# Patient Record
Sex: Female | Born: 1970 | Race: White | Hispanic: No | Marital: Married | Smoking: Never smoker
Health system: Southern US, Community
[De-identification: ages and names within clinical notes are randomized; demographics above are authoritative.]

## PROBLEM LIST (undated history)

## (undated) DIAGNOSIS — F419 Anxiety disorder, unspecified: Secondary | ICD-10-CM

## (undated) HISTORY — PX: BUNIONECTOMY: SHX129

## (undated) HISTORY — PX: COLONOSCOPY: SHX174

## (undated) HISTORY — PX: NO PAST SURGERIES: SHX2092

---

## 2009-08-25 ENCOUNTER — Ambulatory Visit: Payer: Self-pay | Admitting: Family Medicine

## 2010-06-26 ENCOUNTER — Ambulatory Visit: Payer: Self-pay | Admitting: Family Medicine

## 2010-11-08 ENCOUNTER — Ambulatory Visit: Payer: Self-pay | Admitting: Family Medicine

## 2011-11-28 ENCOUNTER — Ambulatory Visit: Payer: Self-pay | Admitting: Family Medicine

## 2012-02-10 DIAGNOSIS — D126 Benign neoplasm of colon, unspecified: Secondary | ICD-10-CM | POA: Insufficient documentation

## 2012-12-10 ENCOUNTER — Ambulatory Visit: Payer: Self-pay | Admitting: Internal Medicine

## 2013-04-16 ENCOUNTER — Ambulatory Visit: Payer: Self-pay

## 2014-04-21 ENCOUNTER — Ambulatory Visit: Payer: Self-pay | Admitting: Family Medicine

## 2014-07-19 DIAGNOSIS — M542 Cervicalgia: Secondary | ICD-10-CM | POA: Insufficient documentation

## 2015-02-16 ENCOUNTER — Encounter: Payer: Self-pay | Admitting: Emergency Medicine

## 2015-02-16 ENCOUNTER — Ambulatory Visit
Admission: EM | Admit: 2015-02-16 | Discharge: 2015-02-16 | Disposition: A | Discharge status: Home / Self Care | Payer: BC Managed Care – PPO | Attending: Emergency Medicine | Admitting: Emergency Medicine

## 2015-02-16 DIAGNOSIS — J019 Acute sinusitis, unspecified: Secondary | ICD-10-CM

## 2015-02-16 MED ORDER — FLUTICASONE PROPIONATE 50 MCG/ACT NA SUSP
2.0000 | Freq: Every day | NASAL | Status: DC
Start: 2015-02-16 — End: 2017-11-15

## 2015-02-16 MED ORDER — IBUPROFEN 800 MG PO TABS: 800.0000 mg | ORAL_TABLET | Freq: Three times a day (TID) | ORAL | Status: DC | PRN

## 2015-02-16 NOTE — ED Provider Notes (Signed)
  SUBJECTIVE:  Victoria Baldwin is a 44 y.o. female who presents with nasal bridge pain, tenderness, frontal/maxillary sinus pressure for the past 4 days. She reports frontal headaches at the end of the day. Symptoms are worse with palpating her nasal bridge better with not touching it, she tried Advil 400 mg and steam inhalation without much improvement. No nausea, vomiting, fevers, postnasal drip, coughing, wheezing. No facial swelling, nasal congestion, dental pain, pain worse with bending forward or lying down, purulent nasal drainage. No ear pain, sick contacts, allergy symptoms. Past medical history negative for diabetes, hypertension, sinusitis, allergies. Denies any trauma to the bridge of her nose.  History reviewed. No pertinent past medical history.  Past Surgical History:   NO PAST SURGERIES                                            No family history on file.     Smoking Status: Never Smoker                     Smokeless Status: Never Used                       Alcohol Use: Yes            No current facility-administered medications for this encounter. Current outpatient prescriptions: .  busPIRone (BUSPAR) 5 MG tablet, Take 5 mg by mouth once., Disp: , Rfl:   No Known Allergies   ROS  As noted in HPI.   Physical Exam  BP 110/73 mmHg  Pulse 75  Temp(Src) 97.4 F (36.3 C) (Tympanic)  Resp 16  Ht 5\' 7"  (1.702 m)  Wt 145 lb (65.772 kg)  BMI 22.71 kg/m2  SpO2 100%  LMP 12/01/2014  Constitutional: Well developed, well nourished, no acute distress Eyes:  EOMI, conjunctiva normal bilaterally HENT: Normocephalic, atraumatic,mucus membranes moist. TMs normal bilaterally. +  clear scant nasal congestion. normal turbinates. + nasal bridge tenderness, no crepitus, bruising. - maxillary, frontal sinus tenderness. Oropharynx normal. - postnasal drip.  Respiratory: Normal inspiratory effort Cardiovascular: Normal rate GI: nondistended skin: No rash, skin  intact Musculoskeletal: no deformities No cervical lymphadenopathy. Neurologic: Alert & oriented x 3, no focal neuro deficits Psychiatric: Speech and behavior appropriate   ED Course   Medications - No data to display  No orders of the defined types were placed in this encounter.    No results found for this or any previous visit (from the past 24 hour(s)). No results found.  ED Clinical Impression  Acute sinusitis, recurrence not specified, unspecified location   ED Assessment/Plan  Could be ethmoidal sinusitis, no evidence of trauma. No fevers >102, has had sx for < 10 days, no h/o double sickening. No historical or objective evidence of bacterial infection. No indication for abx. Will start cool compresses on the nose, nasal steriods, mucinex, increase fluids, nasal saline irrigation,  tylenol/motrin prn pain. Discussed MDM and plan with pt. Discussed sn/sx that should prompt return to the UC or ED. Pt agrees with plan and will f/u with PMD prn.   *This clinic note was created using Dragon dictation software. Therefore, there may be occasional mistakes despite careful proofreading.    Melynda Ripple, MD 02/16/15 [phone]

## 2015-02-16 NOTE — Discharge Instructions (Signed)
Start regular mucinex in addition to the ibuprofen and nasal steroids. Return if you get worse, have a fever >100.4, or for any concerns. You may take 800 mg of motrin with 1 gram of tylenol up to 3 times a day as needed for pain. This is an effective combination for pain.  Most sinus infections are viral and do not need antibiotics unless you have a high fever, have had this for 10 day, or you get better and then get sick again. Use a neti pot or the NeilMed sinus rinse as often as you want to to reduce nasal congestion. Follow the directions on the box.   Go to www.goodrx.com to look up your medications. This will give you a list of where you can find your prescriptions at the most affordable prices.

## 2015-02-16 NOTE — ED Notes (Signed)
Sinus pressure for 4 days

## 2016-04-30 ENCOUNTER — Other Ambulatory Visit: Payer: Self-pay | Admitting: Family Medicine

## 2016-04-30 DIAGNOSIS — Z1231 Encounter for screening mammogram for malignant neoplasm of breast: Secondary | ICD-10-CM

## 2016-05-28 ENCOUNTER — Encounter: Payer: Self-pay | Admitting: Radiology

## 2016-05-28 ENCOUNTER — Ambulatory Visit
Admission: RE | Admit: 2016-05-28 | Discharge: 2016-05-28 | Disposition: A | Discharge status: Home / Self Care | Payer: BC Managed Care – PPO | Source: Ambulatory Visit | Attending: Family Medicine | Admitting: Family Medicine

## 2016-05-28 DIAGNOSIS — Z1231 Encounter for screening mammogram for malignant neoplasm of breast: Secondary | ICD-10-CM | POA: Insufficient documentation

## 2016-11-02 ENCOUNTER — Other Ambulatory Visit: Payer: Self-pay | Admitting: Family Medicine

## 2016-11-02 ENCOUNTER — Ambulatory Visit
Admission: RE | Admit: 2016-11-02 | Discharge: 2016-11-02 | Disposition: A | Discharge status: Home / Self Care | Payer: BC Managed Care – PPO | Source: Ambulatory Visit | Attending: Family Medicine | Admitting: Family Medicine

## 2016-11-02 DIAGNOSIS — N83201 Unspecified ovarian cyst, right side: Secondary | ICD-10-CM | POA: Diagnosis not present

## 2016-11-02 DIAGNOSIS — R102 Pelvic and perineal pain: Secondary | ICD-10-CM

## 2017-01-18 DIAGNOSIS — F419 Anxiety disorder, unspecified: Secondary | ICD-10-CM | POA: Insufficient documentation

## 2017-01-18 LAB — HM COLONOSCOPY

## 2017-05-14 ENCOUNTER — Other Ambulatory Visit: Payer: Self-pay | Admitting: Family Medicine

## 2017-06-27 ENCOUNTER — Other Ambulatory Visit: Payer: Self-pay | Admitting: Family Medicine

## 2017-06-27 DIAGNOSIS — Z1231 Encounter for screening mammogram for malignant neoplasm of breast: Secondary | ICD-10-CM

## 2017-07-15 ENCOUNTER — Encounter (INDEPENDENT_AMBULATORY_CARE_PROVIDER_SITE_OTHER): Payer: Self-pay

## 2017-07-15 ENCOUNTER — Ambulatory Visit
Admission: RE | Admit: 2017-07-15 | Discharge: 2017-07-15 | Disposition: A | Discharge status: Home / Self Care | Payer: BC Managed Care – PPO | Source: Ambulatory Visit | Attending: Family Medicine | Admitting: Family Medicine

## 2017-07-15 DIAGNOSIS — Z1231 Encounter for screening mammogram for malignant neoplasm of breast: Secondary | ICD-10-CM | POA: Insufficient documentation

## 2017-11-15 ENCOUNTER — Encounter: Payer: Self-pay | Admitting: Sports Medicine

## 2017-11-15 ENCOUNTER — Ambulatory Visit: Payer: BC Managed Care – PPO | Admitting: Sports Medicine

## 2017-11-15 DIAGNOSIS — L603 Nail dystrophy: Secondary | ICD-10-CM

## 2017-11-15 DIAGNOSIS — L608 Other nail disorders: Secondary | ICD-10-CM | POA: Diagnosis not present

## 2017-11-15 DIAGNOSIS — M79675 Pain in left toe(s): Secondary | ICD-10-CM | POA: Diagnosis not present

## 2017-11-15 MED ORDER — NEOMYCIN-POLYMYXIN-HC 3.5-10000-1 OT SOLN: OTIC | 0 refills | Status: DC

## 2017-11-15 NOTE — Progress Notes (Signed)
Subjective: Victoria Baldwin is a 47 y.o. female patient seen today in office with complaint of mildly painful thickened left hallux toenail with dried blood states that she bruised it 7 months ago while wearing tennis shoes at a fall festival where she walked for 9 hours straight states that the inner corner has become tender and sore and has used Epson salt.  Reports that pain on average about a 5 out of 10 especially with certain shoes at present with the toe.  Patient states that she wants to discuss treatment options however is very nervous and does not want anything aggressively done.  Patient denies history of Diabetes, Neuropathy, or Vascular disease. Patient has no other pedal complaints at this time.   Review of Systems  Musculoskeletal:       Left toe pain  All other systems reviewed and are negative.   There are no active problems to display for this patient.   No current outpatient medications on file prior to visit.   No current facility-administered medications on file prior to visit.     No Known Allergies  Objective: Physical Exam  General: Well developed, nourished, no acute distress, awake, alert and oriented x 3  Vascular: Dorsalis pedis artery 2/4 bilateral, Posterior tibial artery 2/4 bilateral, skin temperature warm to warm proximal to distal bilateral lower extremities, no varicosities, pedal hair present bilateral.  Neurological: Gross sensation present via light touch bilateral.   Dermatological: Skin is warm, dry, and supple bilateral, left hallux toenail has trauma lines with a distal dry blood and mild incurvation at the lateral margin without swelling without drainage however there is mild tenderness to the margin, all other nails are within normal limits, no webspace macerations present bilateral, no open lesions present bilateral, no callus/corns/hyperkeratotic tissue present bilateral. No signs of infection bilateral.  Musculoskeletal: Mild  asymptomatic bunion deformity noted bilateral. Muscular strength within normal limits without painon range of motion. No pain with calf compression bilateral.  Assessment and Plan:  Problem List Items Addressed This Visit    None    Visit Diagnoses    Nail dystrophy    -  Primary   Toe pain, left       Nail hemorrhage         -Examined patient.  -Discussed treatment options for painful dystrophic left great toenail with dried blood distally and trauma lines; patient declined a nail procedure and opted for me to trim her nail at today's visit -Mechanically debrided and reduced dystrophic nail with sterile nail nipper and dremel nail file without incident. -Recommend patient to soak with Epson salt and apply Neosporin with lidocaine if needed if the corner is sore after I have trimmed today -Patient to return as needed for follow-up evaluation or sooner if symptoms worsen.  Landis Martins, DPM

## 2017-11-25 ENCOUNTER — Encounter: Payer: Self-pay | Admitting: Sports Medicine

## 2017-11-26 ENCOUNTER — Ambulatory Visit (INDEPENDENT_AMBULATORY_CARE_PROVIDER_SITE_OTHER): Payer: BC Managed Care – PPO | Admitting: Podiatry

## 2017-11-26 DIAGNOSIS — L600 Ingrowing nail: Secondary | ICD-10-CM

## 2017-12-04 ENCOUNTER — Ambulatory Visit: Payer: BC Managed Care – PPO | Admitting: Sports Medicine

## 2017-12-15 NOTE — Progress Notes (Signed)
  Subjective:  Patient ID: Victoria Baldwin, female    DOB: July 25, 1970,  MRN: [id number]  Chief Complaint  Patient presents with  . Nail check    L hallux ingrown removed 11-15-17 Pt. stated," it felt good for a while but it feels really sore again (lateral border) ." Tx: drops (rx by Dr. Cannon Kettle), and epsom salt soaking   48 y.o. female returns for the above complaint.  States that the nail fungus for a while but got really sore again on the outside border  Objective:  There were no vitals filed for this visit. General AA&O x3. Normal mood and affect.  Vascular Pedal pulses palpable.  Neurologic Epicritic sensation grossly intact.  Dermatologic  left hallux nail ingrowing lateral borders  Orthopedic:  An outpatient with a left hallux nail   Assessment & Plan:  Patient was evaluated and treated and all questions answered.  Left hallux onychomycosis with pain -Nail debrided in slant back fashion  No follow-ups on file.

## 2018-06-02 ENCOUNTER — Ambulatory Visit (INDEPENDENT_AMBULATORY_CARE_PROVIDER_SITE_OTHER): Payer: BC Managed Care – PPO

## 2018-06-02 ENCOUNTER — Ambulatory Visit: Payer: BC Managed Care – PPO | Admitting: Podiatry

## 2018-06-02 DIAGNOSIS — M21612 Bunion of left foot: Secondary | ICD-10-CM

## 2018-06-02 DIAGNOSIS — F419 Anxiety disorder, unspecified: Secondary | ICD-10-CM

## 2018-06-02 DIAGNOSIS — M7752 Other enthesopathy of left foot: Secondary | ICD-10-CM

## 2018-06-02 DIAGNOSIS — M21962 Unspecified acquired deformity of left lower leg: Secondary | ICD-10-CM

## 2018-06-02 DIAGNOSIS — M2012 Hallux valgus (acquired), left foot: Secondary | ICD-10-CM | POA: Diagnosis not present

## 2018-06-02 DIAGNOSIS — M357 Hypermobility syndrome: Secondary | ICD-10-CM

## 2018-06-02 DIAGNOSIS — F411 Generalized anxiety disorder: Secondary | ICD-10-CM

## 2018-06-02 NOTE — Progress Notes (Signed)
  Subjective:  Patient ID: Victoria Baldwin, female    DOB: 05/09/1971,  MRN: [id number]  Chief Complaint  Patient presents with  . Foot Pain    L hallux joint pain x years; 5/10 sharp pain (worst with walking a lot) -no injury Tx: none   . Foot Pain    L sub met 2 x couple months; 5/10 nerve pain -no injury Tx; none    47 y.o. female presents with the above complaint.  History as above states that she has had a bunion for several years and is starting to hurt more in the past couple years causing pain with her shoe gear also having pain on the bottom of the foot at the ball of the foot.  Review of Systems: Negative except as noted in the HPI. Denies N/V/F/Ch.  No past medical history on file. No current outpatient medications on file.  Social History   Tobacco Use  Smoking Status Never Smoker  Smokeless Tobacco Never Used    Allergies  Allergen Reactions  . Steri-Strip Compound Benzoin [Benzoin Compound]   . Adhesive [Tape] Rash   Objective:  There were no vitals filed for this visit. There is no height or weight on file to calculate BMI. Constitutional Well developed. Well nourished.  Vascular Dorsalis pedis pulses palpable bilaterally. Posterior tibial pulses palpable bilaterally. Capillary refill normal to all digits.  No cyanosis or clubbing noted. Pedal hair growth normal.  Neurologic Normal speech. Oriented to person, place, and time. Epicritic sensation to light touch grossly present bilaterally.  Dermatologic Nails well groomed and normal in appearance. No open wounds. No skin lesions.  Orthopedic: Normal joint ROM without pain or crepitus bilaterally. Hallux abductovalgus deformity present bilaterally Left 1st MPJ full range of motion. Left 1st TMT with gross hypermobility. Right 1st MPJ full range of motion  Right 1st TMT with gross hypermobility. Lesser digital contractures absent bilaterally. Pain to palpation about the plantar second third  metatarsal heads   Radiographs: Taken and reviewed. Hallux abductovalgus deformity present. Metatarsal parabola abnormal - Elongated second third metatarsals, short first metatarsal. 1st/2nd IMA: Moderately increased.  Slight underlying metatarsus adductus  Assessment:   1. Hallux abductovalgus with bunions, left   2. Foot joint hypermobility   3. Metatarsal deformity, left   4. Capsulitis of metatarsophalangeal (MTP) joint of left foot   5. Anxiety due to invasive procedure    Plan:  Patient was evaluated and treated and all questions answered.  Hallux abductovalgus deformity,  -XR as above. -Discussed etiology of HAV deformity.  She has excessive joint mobility of the lower extremity joints leading to HAV formation discussed that anything short of Lapidus correction would likely to lead to recurrence.  Discussed continued conservative versus surgical care. -Patient has failed all conservative therapy and wishes to proceed with surgical intervention. All risks, benefits, and alternatives discussed with patient. No guarantees given. Consent reviewed and signed by patient. Post-op course explained at length. -Planned procedures: L Lapidus bunionectomy, 2nd and possibly 3rd metatarsal shortening osteotomy.  30 minutes of face to face time were spent with the patient. >50% of this was spent on counseling and coordination of care. Specifically discussed with patient the above diagnoses and overall treatment plan.  Return for post-op.

## 2018-06-02 NOTE — Patient Instructions (Signed)
Pre-Operative Instructions  Congratulations, you have decided to take an important step towards improving your quality of life.  You can be assured that the doctors and staff at [facility] will be with you every step of the way.  Here are some important things you should know:  1. Plan to be at the surgery center/hospital at least 1 (one) hour prior to your scheduled time, unless otherwise directed by the surgical center/hospital staff.  You must have a responsible adult accompany you, remain during the surgery and drive you home.  Make sure you have directions to the surgical center/hospital to ensure you arrive on time. 2. If you are having surgery at [facility] or [facility] hospitals, you will need a copy of your medical history and physical form from your family physician within one month prior to the date of surgery. We will give you a form for your primary physician to complete.  3. We make every effort to accommodate the date you request for surgery.  However, there are times where surgery dates or times have to be moved.  We will contact you as soon as possible if a change in schedule is required.   4. No aspirin/ibuprofen for one week before surgery.  If you are on aspirin, any non-steroidal anti-inflammatory medications (Mobic, Aleve, Ibuprofen) should not be taken seven (7) days prior to your surgery.  You make take Tylenol for pain prior to surgery.  5. Medications - If you are taking daily heart and blood pressure medications, seizure, reflux, allergy, asthma, anxiety, pain or diabetes medications, make sure you notify the surgery center/hospital before the day of surgery so they can tell you which medications you should take or avoid the day of surgery. 6. No food or drink after midnight the night before surgery unless directed otherwise by surgical center/hospital staff. 7. No alcoholic beverages 24-hours prior to surgery.  No smoking 24-hours prior or 24-hours after  surgery. 8. Wear loose pants or shorts. They should be loose enough to fit over bandages, boots, and casts. 9. Don't wear slip-on shoes. Sneakers are preferred. 10. Bring your boot with you to the surgery center/hospital.  Also bring crutches or a walker if your physician has prescribed it for you.  If you do not have this equipment, it will be provided for you after surgery. 11. If you have not been contacted by the surgery center/hospital by the day before your surgery, call to confirm the date and time of your surgery. 12. Leave-time from work may vary depending on the type of surgery you have.  Appropriate arrangements should be made prior to surgery with your employer. 13. Prescriptions will be provided immediately following surgery by your doctor.  Fill these as soon as possible after surgery and take the medication as directed. Pain medications will not be refilled on weekends and must be approved by the doctor. 14. Remove nail polish on the operative foot and avoid getting pedicures prior to surgery. 15. Wash the night before surgery.  The night before surgery wash the foot and leg well with water and the antibacterial soap provided. Be sure to pay special attention to beneath the toenails and in between the toes.  Wash for at least three (3) minutes. Rinse thoroughly with water and dry well with a towel.  Perform this wash unless told not to do so by your physician.  Enclosed: 1 Ice pack (please put in freezer the night before surgery)   1 Hibiclens skin cleaner     Pre-op instructions  If you have any questions regarding the instructions, please do not hesitate to call our office.  [place]: [address], , [place] [address] -- [phone]  [place]: [address] [place], [place] [address] -- [phone]  [place]: [address]  [place], [place] [address] -- [phone]  [place]: [address], [address], [place], [place] [address] -- [phone]  Website:  [email or web address] 

## 2018-06-03 ENCOUNTER — Telehealth: Payer: Self-pay | Admitting: *Deleted

## 2018-06-03 ENCOUNTER — Encounter: Payer: Self-pay | Admitting: Podiatry

## 2018-06-03 NOTE — Telephone Encounter (Signed)
I am calling to let you know I have you scheduled for your surgery on tomorrow.  "On tomorrow, I didn't realize he was talking about tomorrow.  I know he mentioned doing it on Friday but I told him I can't do it on Friday.  I just saw him once which was yesterday.  I want to wait until January."  I am sorry for the misunderstanding.  Dr. March Rummage does surgery on Wednesdays.  Do you have a date in mine that you would like to have it done?  "Does he have any time available on January 22?"  Yes, he can do it on January 22.  "I guess I will hear from someone closer to the date?"  Yes, that is correct.  Someone from the surgical center will give you a call a day or two prior to your surgery date.  I called and left a message for Caren Griffins at the surgical center.  I asked her to reschedule Ms. Chana Bode from December 18 to January 22.

## 2018-06-03 NOTE — Telephone Encounter (Signed)
Postop appointments have been rescheduled to reflect new surgery date.

## 2018-06-09 ENCOUNTER — Other Ambulatory Visit: Payer: Self-pay | Admitting: Podiatry

## 2018-06-09 DIAGNOSIS — M21962 Unspecified acquired deformity of left lower leg: Secondary | ICD-10-CM

## 2018-06-09 DIAGNOSIS — M2012 Hallux valgus (acquired), left foot: Secondary | ICD-10-CM

## 2018-06-09 DIAGNOSIS — M21612 Bunion of left foot: Principal | ICD-10-CM

## 2018-06-13 ENCOUNTER — Encounter: Payer: BC Managed Care – PPO | Admitting: Podiatry

## 2018-06-23 ENCOUNTER — Encounter: Payer: BC Managed Care – PPO | Admitting: Podiatry

## 2018-07-09 ENCOUNTER — Other Ambulatory Visit: Payer: Self-pay | Admitting: Podiatry

## 2018-07-09 ENCOUNTER — Other Ambulatory Visit: Payer: Self-pay | Admitting: *Deleted

## 2018-07-09 DIAGNOSIS — M2022 Hallux rigidus, left foot: Secondary | ICD-10-CM

## 2018-07-09 DIAGNOSIS — M2012 Hallux valgus (acquired), left foot: Secondary | ICD-10-CM

## 2018-07-09 MED ORDER — OXYCODONE-ACETAMINOPHEN 10-325 MG PO TABS: 1.0000 | ORAL_TABLET | ORAL | 0 refills | Status: DC | PRN

## 2018-07-09 MED ORDER — ONDANSETRON HCL 4 MG PO TABS: 4.0000 mg | ORAL_TABLET | Freq: Three times a day (TID) | ORAL | 0 refills | Status: DC | PRN

## 2018-07-09 MED ORDER — CEPHALEXIN 500 MG PO CAPS: 500.0000 mg | ORAL_CAPSULE | Freq: Two times a day (BID) | ORAL | 0 refills | Status: DC

## 2018-07-09 NOTE — Progress Notes (Signed)
Rx sent to pharmacy for outpatient surgery. °

## 2018-07-09 NOTE — Progress Notes (Signed)
Per Dr. March Rummage, I put in an order for a knee scooter.  I sent a message to Patsy Baltimore at [facility] to see if they can assist in getting the patient the knee scooter.

## 2018-07-10 ENCOUNTER — Telehealth: Payer: Self-pay | Admitting: Podiatry

## 2018-07-10 NOTE — Telephone Encounter (Signed)
Pt had surgery yesterday and would like to know when she can expect to get her knee scooter. Please give pt a call.

## 2018-07-10 NOTE — Telephone Encounter (Signed)
I informed pt Victoria Baldwin states they are processing her knee scooter and will contact her. I told pt she should get a call tomorrow morning if not I would give her their phone number and she should call them. Pt states she can't get up and I told her I would leave the number on her voice mail. I called pt and left [phone].

## 2018-07-15 ENCOUNTER — Ambulatory Visit (INDEPENDENT_AMBULATORY_CARE_PROVIDER_SITE_OTHER): Payer: BC Managed Care – PPO | Admitting: Podiatry

## 2018-07-15 ENCOUNTER — Encounter: Payer: Self-pay | Admitting: Podiatry

## 2018-07-15 ENCOUNTER — Ambulatory Visit (INDEPENDENT_AMBULATORY_CARE_PROVIDER_SITE_OTHER): Payer: BC Managed Care – PPO

## 2018-07-15 ENCOUNTER — Other Ambulatory Visit: Payer: Self-pay

## 2018-07-15 VITALS — BP 99/57 | HR 77 | Temp 97.3°F | Resp 16

## 2018-07-15 DIAGNOSIS — M21962 Unspecified acquired deformity of left lower leg: Secondary | ICD-10-CM | POA: Diagnosis not present

## 2018-07-15 DIAGNOSIS — Z9889 Other specified postprocedural states: Secondary | ICD-10-CM

## 2018-07-15 DIAGNOSIS — M2012 Hallux valgus (acquired), left foot: Secondary | ICD-10-CM

## 2018-07-15 DIAGNOSIS — M21612 Bunion of left foot: Secondary | ICD-10-CM

## 2018-07-15 DIAGNOSIS — M357 Hypermobility syndrome: Secondary | ICD-10-CM

## 2018-07-15 NOTE — Progress Notes (Signed)
  Subjective:  Patient ID: Victoria Baldwin, female    DOB: 1971-03-30,  MRN: [id number]  Chief Complaint  Patient presents with  . Routine Post Op    POV #1 Pt.s tates," seems to be doing fine, but with pain; 6/10 pain." Tx: cam boot, scooter, tylenol and icing -pt deneis V/F/Ch -d/c oxycodone due to Nausea    DOS: 07/09/2018 Procedure: Left Lapidus Bunionectomy  48 y.o. female returns for post-op check.   Review of Systems: Negative except as noted in the HPI. Denies N/V/F/Ch.  No past medical history on file.  Current Outpatient Medications:  .  cephALEXin (KEFLEX) 500 MG capsule, Take 1 capsule (500 mg total) by mouth 2 (two) times daily., Disp: 14 capsule, Rfl: 0 .  ondansetron (ZOFRAN) 4 MG tablet, Take 1 tablet (4 mg total) by mouth every 8 (eight) hours as needed for nausea or vomiting., Disp: 20 tablet, Rfl: 0 .  oxyCODONE-acetaminophen (PERCOCET) 10-325 MG tablet, Take 1 tablet by mouth every 4 (four) hours as needed for pain., Disp: 20 tablet, Rfl: 0 .  progesterone (PROMETRIUM) 200 MG capsule, , Disp: , Rfl:   Social History   Tobacco Use  Smoking Status Never Smoker  Smokeless Tobacco Never Used    Allergies  Allergen Reactions  . Steri-Strip Compound Benzoin [Benzoin Compound]   . Adhesive [Tape] Rash   Objective:   Vitals:   07/15/18 1111  BP: (!) 99/57  Pulse: 77  Resp: 16  Temp: (!) 97.3 F (36.3 C)   There is no height or weight on file to calculate BMI. Constitutional Well developed. Well nourished.  Vascular Foot warm and well perfused. Capillary refill normal to all digits.   Neurologic Normal speech. Oriented to person, place, and time. Epicritic sensation to light touch grossly present bilaterally.  Dermatologic Skin healing well without signs of infection. Skin edges well coapted without signs of infection.  Orthopedic: Tenderness to palpation noted about the surgical site.   Radiographs: Taken and reviewed c/w post-op  state Assessment:   1. Post-operative state   2. Hallux abducto valgus, left   3. Hallux abductovalgus with bunions, left   4. Foot joint hypermobility   5. Metatarsal deformity, left    Plan:  Patient was evaluated and treated and all questions answered.  S/p foot surgery left -Progressing as expected post-operatively. -XR: As abov3 -WB Status: NWB in CAM boot with knee scooter -Sutures: intact. -Medications: none refilled. -Foot redressed.  No follow-ups on file.

## 2018-07-22 ENCOUNTER — Encounter: Payer: Self-pay | Admitting: Podiatry

## 2018-07-22 ENCOUNTER — Ambulatory Visit (INDEPENDENT_AMBULATORY_CARE_PROVIDER_SITE_OTHER): Payer: BC Managed Care – PPO | Admitting: Podiatry

## 2018-07-22 VITALS — BP 111/70 | HR 84 | Temp 97.7°F | Resp 16

## 2018-07-22 DIAGNOSIS — Z9889 Other specified postprocedural states: Secondary | ICD-10-CM

## 2018-07-22 DIAGNOSIS — M2012 Hallux valgus (acquired), left foot: Secondary | ICD-10-CM

## 2018-07-23 NOTE — Progress Notes (Signed)
  Subjective:  Patient ID: Victoria Baldwin, female    DOB: 1970-10-03,  MRN: [id number]  Chief Complaint  Patient presents with  . Routine Post Op    POV #2 Pt.s tates," Sometimes on the toe I geel like a hot knife cutting throught it and it gets numbn, but pain is not greather than 5." Tx: cam boot and elevation -pt denies N/V?F?Ch     DOS: 07/09/2018 Procedure: Left Lapidus Bunionectomy  48 y.o. female returns for post-op check.   Review of Systems: Negative except as noted in the HPI. Denies N/V/F/Ch.  No past medical history on file.  Current Outpatient Medications:  .  ondansetron (ZOFRAN) 4 MG tablet, Take 1 tablet (4 mg total) by mouth every 8 (eight) hours as needed for nausea or vomiting., Disp: 20 tablet, Rfl: 0 .  progesterone (PROMETRIUM) 200 MG capsule, , Disp: , Rfl:   Social History   Tobacco Use  Smoking Status Never Smoker  Smokeless Tobacco Never Used    Allergies  Allergen Reactions  . Steri-Strip Compound Benzoin [Benzoin Compound]   . Adhesive [Tape] Rash   Objective:   Vitals:   07/22/18 1111  BP: 111/70  Pulse: 84  Resp: 16  Temp: 97.7 F (36.5 C)   There is no height or weight on file to calculate BMI. Constitutional Well developed. Well nourished.  Vascular Foot warm and well perfused. Capillary refill normal to all digits.   Neurologic Normal speech. Oriented to person, place, and time. Epicritic sensation to light touch grossly present bilaterally.  Dermatologic Skin healing well without signs of infection. Skin edges well coapted without signs of infection.  Orthopedic: Tenderness to palpation noted about the surgical site.   Radiographs: None Assessment:   1. Hallux abducto valgus, left   2. Post-operative state    Plan:  Patient was evaluated and treated and all questions answered.  S/p foot surgery left -Progressing as expected post-operatively. -XR: As above -WB Status: NWB in CAM boot with knee scooter -Sutures:  intact. Absorbable. Ok to shower but not soak. -Medications: none refilled. -Foot redressed.  No follow-ups on file.

## 2018-07-28 ENCOUNTER — Other Ambulatory Visit: Payer: Self-pay | Admitting: Family Medicine

## 2018-07-28 DIAGNOSIS — Z1231 Encounter for screening mammogram for malignant neoplasm of breast: Secondary | ICD-10-CM

## 2018-08-05 ENCOUNTER — Ambulatory Visit (INDEPENDENT_AMBULATORY_CARE_PROVIDER_SITE_OTHER): Payer: BC Managed Care – PPO | Admitting: Podiatry

## 2018-08-05 ENCOUNTER — Ambulatory Visit (INDEPENDENT_AMBULATORY_CARE_PROVIDER_SITE_OTHER): Payer: BC Managed Care – PPO

## 2018-08-05 DIAGNOSIS — M2012 Hallux valgus (acquired), left foot: Secondary | ICD-10-CM | POA: Diagnosis not present

## 2018-08-05 DIAGNOSIS — Z9889 Other specified postprocedural states: Secondary | ICD-10-CM

## 2018-08-05 DIAGNOSIS — M7752 Other enthesopathy of left foot: Secondary | ICD-10-CM

## 2018-08-05 DIAGNOSIS — M357 Hypermobility syndrome: Secondary | ICD-10-CM | POA: Diagnosis not present

## 2018-08-05 DIAGNOSIS — M21612 Bunion of left foot: Secondary | ICD-10-CM

## 2018-08-05 DIAGNOSIS — M21962 Unspecified acquired deformity of left lower leg: Secondary | ICD-10-CM | POA: Diagnosis not present

## 2018-08-07 ENCOUNTER — Ambulatory Visit
Admission: RE | Admit: 2018-08-07 | Discharge: 2018-08-07 | Disposition: A | Discharge status: Home / Self Care | Payer: BC Managed Care – PPO | Source: Ambulatory Visit | Attending: Family Medicine | Admitting: Family Medicine

## 2018-08-07 DIAGNOSIS — Z1231 Encounter for screening mammogram for malignant neoplasm of breast: Secondary | ICD-10-CM | POA: Diagnosis not present

## 2018-08-18 NOTE — Progress Notes (Signed)
  Subjective:  Patient ID: Victoria Baldwin, female    DOB: 02-23-1971,  MRN: [id number]  No chief complaint on file.  DOS: 07/09/2018 Procedure: Left Lapidus Bunionectomy  48 y.o. female returns for post-op check.  States that she feels like a hot knife cuts through her toe at points and it gets numb but does not have any pain.  Review of Systems: Negative except as noted in the HPI. Denies N/V/F/Ch.  No past medical history on file.  Current Outpatient Medications:  .  ondansetron (ZOFRAN) 4 MG tablet, Take 1 tablet (4 mg total) by mouth every 8 (eight) hours as needed for nausea or vomiting., Disp: 20 tablet, Rfl: 0 .  progesterone (PROMETRIUM) 200 MG capsule, , Disp: , Rfl:   Social History   Tobacco Use  Smoking Status Never Smoker  Smokeless Tobacco Never Used    Allergies  Allergen Reactions  . Steri-Strip Compound Benzoin [Benzoin Compound]   . Adhesive [Tape] Rash   Objective:   There were no vitals filed for this visit. There is no height or weight on file to calculate BMI. Constitutional Well developed. Well nourished.  Vascular Foot warm and well perfused. Capillary refill normal to all digits.   Neurologic Normal speech. Oriented to person, place, and time. Epicritic sensation to light touch grossly present bilaterally.  Dermatologic Skin healed  Orthopedic: No tenderness to palpation noted about the surgical site.   Radiographs: XR show osseous bridging of arthrodesis site. Hardware intact. Assessment:   1. Post-operative state   2. Hallux abducto valgus, left   3. Hallux abductovalgus with bunions, left   4. Foot joint hypermobility   5. Metatarsal deformity, left   6. Capsulitis of metatarsophalangeal (MTP) joint of left foot    Plan:  Patient was evaluated and treated and all questions answered.  S/p foot surgery left -Progressing as expected post-operatively. -XR: As above -WB Status: NWB in CAM boot with knee scooter -Skin  healed. -Medications: none refilled. -Foot redressed.  No follow-ups on file.

## 2018-08-19 ENCOUNTER — Ambulatory Visit (INDEPENDENT_AMBULATORY_CARE_PROVIDER_SITE_OTHER): Payer: BC Managed Care – PPO

## 2018-08-19 ENCOUNTER — Encounter: Payer: Self-pay | Admitting: Podiatry

## 2018-08-19 ENCOUNTER — Ambulatory Visit (INDEPENDENT_AMBULATORY_CARE_PROVIDER_SITE_OTHER): Payer: BC Managed Care – PPO | Admitting: Podiatry

## 2018-08-19 VITALS — BP 108/63 | HR 96 | Temp 97.8°F | Resp 16

## 2018-08-19 DIAGNOSIS — R600 Localized edema: Secondary | ICD-10-CM

## 2018-08-19 DIAGNOSIS — M2012 Hallux valgus (acquired), left foot: Secondary | ICD-10-CM

## 2018-08-19 DIAGNOSIS — Z9889 Other specified postprocedural states: Secondary | ICD-10-CM

## 2018-08-19 NOTE — Progress Notes (Signed)
  Subjective:  Patient ID: Victoria Baldwin, female    DOB: [birth year],  MRN: [id number]  Chief Complaint  Patient presents with  . Routine Post Op    pov#4 dos 01.22.2020 [facility] Procedure Including Bunionectomy Lt, Metatarsal Osteotomy 2nd Lt, Poss. 3rd Lt Pt. states," I don't see how I'm going to start walking since it's still very swollen." tx: cam boot, icing and elevation -pt deneis pain/N/V/F?Ch    DOS: 07/09/2018 Procedure: Left Lapidus Bunionectomy  48 y.o. female returns for post-op check.  History above confirmed with patient  Review of Systems: Negative except as noted in the HPI. Denies N/V/F/Ch.  No past medical history on file.  Current Outpatient Medications:  .  ondansetron (ZOFRAN) 4 MG tablet, Take 1 tablet (4 mg total) by mouth every 8 (eight) hours as needed for nausea or vomiting., Disp: 20 tablet, Rfl: 0 .  progesterone (PROMETRIUM) 200 MG capsule, , Disp: , Rfl:   Social History   Tobacco Use  Smoking Status Never Smoker  Smokeless Tobacco Never Used    Allergies  Allergen Reactions  . Steri-Strip Compound Benzoin [Benzoin Compound]   . Adhesive [Tape] Rash   Objective:   Vitals:   08/19/18 1051  BP: 108/63  Pulse: 96  Resp: 16  Temp: 97.8 F (36.6 C)   There is no height or weight on file to calculate BMI. Constitutional Well developed. Well nourished.  Vascular Foot warm and well perfused. Capillary refill normal to all digits.   Neurologic Normal speech. Oriented to person, place, and time. Epicritic sensation to light touch grossly present bilaterally.  Dermatologic Skin healed  Orthopedic: No tenderness to palpation noted about the surgical site.   Radiographs: X-ray shows intact hardware with bridging of the arthrodesis site Assessment:   1. Hallux abducto valgus, left   2. Localized edema   3. Post-operative state    Plan:  Patient was evaluated and treated and all questions answered.  S/p foot surgery  left -Progressing as expected post-operatively. -XR: As above -WB Status: Transition to weight-bear as tolerated in cam boot -Skin healed. -Medications: none refilled. -Compression ankle dispensed  Return in about 2 weeks (around 09/02/2018) for Post-op no XRs .

## 2018-08-21 ENCOUNTER — Other Ambulatory Visit: Payer: Self-pay | Admitting: Podiatry

## 2018-08-21 DIAGNOSIS — Z9889 Other specified postprocedural states: Secondary | ICD-10-CM

## 2018-08-21 DIAGNOSIS — R600 Localized edema: Secondary | ICD-10-CM

## 2018-08-21 DIAGNOSIS — M2012 Hallux valgus (acquired), left foot: Secondary | ICD-10-CM

## 2018-09-02 ENCOUNTER — Encounter: Payer: BC Managed Care – PPO | Admitting: Podiatry

## 2018-09-08 ENCOUNTER — Telehealth: Payer: Self-pay | Admitting: Podiatry

## 2018-09-08 ENCOUNTER — Encounter: Payer: BC Managed Care – PPO | Admitting: Podiatry

## 2018-09-08 NOTE — Telephone Encounter (Signed)
Post-op Telephone Encounter  Due to the ongoing coronavirus pandemic, patient was called for postop check and virtual visit rather than having her come into the office.   Patient states the swelling in her foot is down she is having no pain or discomfort.  She denies issues thus far. I advised the patient at this point she can safely transition to her normal sneaker for 2 hours today and slowly increasing over time so that she can tolerate it for a full day.    We will have the patient follow-up in 2 weeks for recheck.

## 2018-09-09 ENCOUNTER — Encounter: Payer: BC Managed Care – PPO | Admitting: Podiatry

## 2018-09-30 ENCOUNTER — Ambulatory Visit (INDEPENDENT_AMBULATORY_CARE_PROVIDER_SITE_OTHER): Payer: BC Managed Care – PPO

## 2018-09-30 ENCOUNTER — Encounter: Payer: BC Managed Care – PPO | Admitting: Podiatry

## 2018-09-30 ENCOUNTER — Ambulatory Visit: Payer: BC Managed Care – PPO

## 2018-09-30 ENCOUNTER — Encounter: Payer: Self-pay | Admitting: Podiatry

## 2018-09-30 ENCOUNTER — Other Ambulatory Visit: Payer: Self-pay

## 2018-09-30 ENCOUNTER — Ambulatory Visit (INDEPENDENT_AMBULATORY_CARE_PROVIDER_SITE_OTHER): Payer: BC Managed Care – PPO | Admitting: Podiatry

## 2018-09-30 VITALS — Temp 97.8°F | Resp 16

## 2018-09-30 DIAGNOSIS — Z9889 Other specified postprocedural states: Secondary | ICD-10-CM

## 2018-09-30 DIAGNOSIS — M2012 Hallux valgus (acquired), left foot: Secondary | ICD-10-CM

## 2018-09-30 DIAGNOSIS — M21612 Bunion of left foot: Secondary | ICD-10-CM

## 2018-09-30 NOTE — Progress Notes (Signed)
  Subjective:  Patient ID: Victoria Baldwin, female    DOB: [birth year],  MRN: [id number]  Chief Complaint  Patient presents with  . Routine Post Op     pov#5 dos 01.22.2020 [facility] Procedure Including Bunionectomy Lt, Metatarsal Osteotomy 2nd Lt, Poss. 3rd Lt  Pt. states," someitmes at the end of the day it;s sore but it's still numbn and tingly around big toe." Tx: none -pt denies N/V/F?Ch    DOS: 07/09/2018 Procedure: Left Lapidus Bunionectomy  48 y.o. female returns for post-op check.  History above confirmed with patient  Review of Systems: Negative except as noted in the HPI. Denies N/V/F/Ch.  No past medical history on file.  Current Outpatient Medications:  .  ondansetron (ZOFRAN) 4 MG tablet, Take 1 tablet (4 mg total) by mouth every 8 (eight) hours as needed for nausea or vomiting., Disp: 20 tablet, Rfl: 0 .  progesterone (PROMETRIUM) 200 MG capsule, , Disp: , Rfl:   Social History   Tobacco Use  Smoking Status Never Smoker  Smokeless Tobacco Never Used    Allergies  Allergen Reactions  . Steri-Strip Compound Benzoin [Benzoin Compound]   . Adhesive [Tape] Rash   Objective:   Vitals:   09/30/18 1231  Resp: 16  Temp: 97.8 F (36.6 C)   There is no height or weight on file to calculate BMI. Constitutional Well developed. Well nourished.  Vascular Foot warm and well perfused. Capillary refill normal to all digits.   Neurologic Normal speech. Oriented to person, place, and time. Epicritic sensation to light touch grossly present. Slight numbness along incision.  Dermatologic Skin healed  Orthopedic: No tenderness to palpation noted about the surgical site.   Radiographs XR taken arthrodesis healed Assessment:   1. Post-operative state   2. Hallux abductovalgus with bunions, left    Plan:  Patient was evaluated and treated and all questions answered.  S/p foot surgery left -Progressing as expected post-operatively. -XR: As above -WB Status:  Transition to weight-bear as tolerated in cam boot -Skin healed. -Medications: none refilled. -Discussed likely transience of neurological symptoms.  Return in about 4 weeks (around 10/28/2018) for Bunion left, possible right foot planning.

## 2018-10-28 ENCOUNTER — Other Ambulatory Visit: Payer: Self-pay

## 2018-10-28 ENCOUNTER — Ambulatory Visit (INDEPENDENT_AMBULATORY_CARE_PROVIDER_SITE_OTHER): Payer: BC Managed Care – PPO | Admitting: Podiatry

## 2018-10-28 ENCOUNTER — Encounter: Payer: Self-pay | Admitting: Podiatry

## 2018-10-28 VITALS — BP 111/68 | HR 67 | Temp 97.1°F | Resp 16

## 2018-10-28 DIAGNOSIS — R600 Localized edema: Secondary | ICD-10-CM | POA: Diagnosis not present

## 2018-10-28 DIAGNOSIS — M2012 Hallux valgus (acquired), left foot: Secondary | ICD-10-CM | POA: Diagnosis not present

## 2018-10-28 DIAGNOSIS — M21612 Bunion of left foot: Secondary | ICD-10-CM | POA: Diagnosis not present

## 2018-10-28 NOTE — Progress Notes (Signed)
  Subjective:  Patient ID: Victoria Baldwin, female    DOB: January 12, 1971,  MRN: [id number]  Chief Complaint  Patient presents with  . Routine Post Op    pov dos 01.22.2020 [facility] Procedure Including Bunionectomy Lt, Metatarsal Osteotomy 2nd Lt, Poss. 3rd Lt Pt. states," still swells up and it feels funny, it still had the nerve feeling." Tx: pt states she d/c the compound cream because it gave her a rash -pt deneis N/V/Ch    DOS: 07/09/2018 Procedure: Left Lapidus Bunionectomy  48 y.o. female returns for post-op check.  History above confirmed with patient  Review of Systems: Negative except as noted in the HPI. Denies N/V/F/Ch.  No past medical history on file.  Current Outpatient Medications:  .  ondansetron (ZOFRAN) 4 MG tablet, Take 1 tablet (4 mg total) by mouth every 8 (eight) hours as needed for nausea or vomiting., Disp: 20 tablet, Rfl: 0 .  progesterone (PROMETRIUM) 200 MG capsule, , Disp: , Rfl:   Social History   Tobacco Use  Smoking Status Never Smoker  Smokeless Tobacco Never Used    Allergies  Allergen Reactions  . Steri-Strip Compound Benzoin [Benzoin Compound]   . Adhesive [Tape] Rash   Objective:   Vitals:   10/28/18 1354  BP: 111/68  Pulse: 67  Resp: 16  Temp: (!) 97.1 F (36.2 C)   There is no height or weight on file to calculate BMI. Constitutional Well developed. Well nourished.  Vascular Foot warm and well perfused. Capillary refill normal to all digits.   Neurologic Normal speech. Oriented to person, place, and time. Epicritic sensation to light touch grossly present. Slight numbness along incision proximally.  Dermatologic Skin healed  Orthopedic: No tenderness to palpation noted about the surgical site. Good 1st metatarsal position slight hallux interphalangeus deformity.   Radiographs: None today. Assessment:   1. Hallux abductovalgus with bunions, left   2. Localized edema    Plan:  Patient was evaluated and treated and all  questions answered.  S/p foot surgery left -Progressing as expected post-operatively. -XR: As above -WB Status: Transition to weight-bear as tolerated in cam boot -Skin healed. -Medications: none refilled. -Discussed likely transience of neurological symptoms.  No follow-ups on file.

## 2018-12-30 ENCOUNTER — Ambulatory Visit: Payer: BC Managed Care – PPO | Admitting: Podiatry

## 2018-12-30 ENCOUNTER — Other Ambulatory Visit: Payer: Self-pay

## 2018-12-30 ENCOUNTER — Encounter: Payer: Self-pay | Admitting: Podiatry

## 2018-12-30 VITALS — Temp 98.4°F | Resp 16

## 2018-12-30 DIAGNOSIS — M21612 Bunion of left foot: Secondary | ICD-10-CM | POA: Diagnosis not present

## 2018-12-30 DIAGNOSIS — M2012 Hallux valgus (acquired), left foot: Secondary | ICD-10-CM | POA: Diagnosis not present

## 2018-12-30 DIAGNOSIS — M357 Hypermobility syndrome: Secondary | ICD-10-CM | POA: Diagnosis not present

## 2018-12-30 NOTE — Progress Notes (Signed)
  Subjective:  Patient ID: Victoria Baldwin, female    DOB: 1970-07-23,  MRN: [id number]  Chief Complaint  Patient presents with  . Routine Post Op    2 mo f/u  [facility] Procedure Including Bunionectomy Lt, Metatarsal Osteotomy 2nd Lt, Poss. 3rd Lt: Pt. states," it's been doing good, no pain." Tx: none -p[t denies pain/redness/swelling/warmth   DOS: 07/09/2018 Procedure: Left Lapidus Bunionectomy  48 y.o. female returns for post-op check.  History above confirmed with patient. Doing well denies pain or post-op concerns. Pleased with surgical result. Ambulating in flip flops today denies difficulty with shoegear.  Review of Systems: Negative except as noted in the HPI. Denies N/V/F/Ch.  No past medical history on file.  Current Outpatient Medications:  .  ondansetron (ZOFRAN) 4 MG tablet, Take 1 tablet (4 mg total) by mouth every 8 (eight) hours as needed for nausea or vomiting., Disp: 20 tablet, Rfl: 0 .  progesterone (PROMETRIUM) 200 MG capsule, , Disp: , Rfl:   Social History   Tobacco Use  Smoking Status Never Smoker  Smokeless Tobacco Never Used    Allergies  Allergen Reactions  . Steri-Strip Compound Benzoin [Benzoin Compound]   . Adhesive [Tape] Rash   Objective:   Vitals:   12/30/18 1359  Resp: 16  Temp: 98.4 F (36.9 C)   There is no height or weight on file to calculate BMI. Constitutional Well developed. Well nourished.  Vascular Foot warm and well perfused. Capillary refill normal to all digits.   Neurologic Normal speech. Oriented to person, place, and time. Epicritic sensation to light touch grossly present.  Dermatologic Skin well healed thin scarring noted.  Orthopedic: No tenderness to palpation noted about the surgical site. Good 1st metatarsal position good 1st MPJ ROM.   Radiographs: None today. Assessment:   1. Hallux abductovalgus with bunions, left   2. Foot joint hypermobility    Plan:  Patient was evaluated and treated and all  questions answered.  S/p foot surgery left -Progressing as expected post-operatively. -XR: As above -WB Status: WB in normal shoegear.  Return if symptoms worsen or fail to improve.

## 2019-08-14 LAB — HM PAP SMEAR: HM Pap smear: NORMAL

## 2019-08-14 LAB — RESULTS CONSOLE HPV: CHL HPV: NEGATIVE

## 2019-08-17 ENCOUNTER — Other Ambulatory Visit: Payer: Self-pay | Admitting: Family Medicine

## 2019-08-17 DIAGNOSIS — Z1231 Encounter for screening mammogram for malignant neoplasm of breast: Secondary | ICD-10-CM

## 2019-08-31 ENCOUNTER — Ambulatory Visit
Admission: RE | Admit: 2019-08-31 | Discharge: 2019-08-31 | Disposition: A | Discharge status: Home / Self Care | Payer: BC Managed Care – PPO | Source: Ambulatory Visit | Attending: Family Medicine | Admitting: Family Medicine

## 2019-08-31 DIAGNOSIS — Z1231 Encounter for screening mammogram for malignant neoplasm of breast: Secondary | ICD-10-CM | POA: Diagnosis present

## 2019-09-15 ENCOUNTER — Other Ambulatory Visit: Payer: Self-pay | Admitting: Physician Assistant

## 2019-09-15 DIAGNOSIS — N950 Postmenopausal bleeding: Secondary | ICD-10-CM

## 2019-09-30 ENCOUNTER — Ambulatory Visit
Admission: RE | Admit: 2019-09-30 | Discharge: 2019-09-30 | Disposition: A | Discharge status: Home / Self Care | Payer: BC Managed Care – PPO | Source: Ambulatory Visit | Attending: Physician Assistant | Admitting: Physician Assistant

## 2019-09-30 ENCOUNTER — Other Ambulatory Visit: Payer: Self-pay

## 2019-09-30 DIAGNOSIS — N950 Postmenopausal bleeding: Secondary | ICD-10-CM | POA: Diagnosis not present

## 2019-10-05 ENCOUNTER — Other Ambulatory Visit: Payer: Self-pay

## 2019-10-05 ENCOUNTER — Encounter: Payer: Self-pay | Admitting: Obstetrics and Gynecology

## 2019-10-05 ENCOUNTER — Ambulatory Visit (INDEPENDENT_AMBULATORY_CARE_PROVIDER_SITE_OTHER): Payer: BC Managed Care – PPO | Admitting: Obstetrics and Gynecology

## 2019-10-05 ENCOUNTER — Other Ambulatory Visit (HOSPITAL_COMMUNITY)
Admission: RE | Admit: 2019-10-05 | Discharge: 2019-10-05 | Disposition: A | Discharge status: Home / Self Care | Payer: BC Managed Care – PPO | Source: Ambulatory Visit | Attending: Obstetrics and Gynecology | Admitting: Obstetrics and Gynecology

## 2019-10-05 VITALS — BP 100/68 | Ht 67.0 in | Wt 144.0 lb

## 2019-10-05 DIAGNOSIS — N950 Postmenopausal bleeding: Secondary | ICD-10-CM

## 2019-10-05 NOTE — Patient Instructions (Signed)
Endometrial Biopsy, Care After This sheet gives you information about how to care for yourself after your procedure. Your health care provider may also give you more specific instructions. If you have problems or questions, contact your health care provider. What can I expect after the procedure? After the procedure, it is common to have:  Mild cramping.  A small amount of vaginal bleeding for a few days. This is normal. Follow these instructions at home:   Take over-the-counter and prescription medicines only as told by your health care provider.  Do not douche, use tampons, or have sexual intercourse until your health care provider approves.  Return to your normal activities as told by your health care provider. Ask your health care provider what activities are safe for you.  Follow instructions from your health care provider about any activity restrictions, such as restrictions on strenuous exercise or heavy lifting. Contact a health care provider if:  You have heavy bleeding, or bleed for longer than 2 days after the procedure.  You have bad smelling discharge from your vagina.  You have a fever or chills.  You have a burning sensation when urinating or you have difficulty urinating.  You have severe pain in your lower abdomen. Get help right away if:  You have severe cramps in your stomach or back.  You pass large blood clots.  Your bleeding increases.  You become weak or light-headed, or you pass out. Summary  After the procedure, it is common to have mild cramping and a small amount of vaginal bleeding for a few days.  Do not douche, use tampons, or have sexual intercourse until your health care provider approves.  Return to your normal activities as told by your health care provider. Ask your health care provider what activities are safe for you. This information is not intended to replace advice given to you by your health care provider. Make sure you discuss any  questions you have with your health care provider. Document Revised: 05/17/2017 Document Reviewed: 06/20/2016 Elsevier Patient Education  [place]. Postmenopausal Bleeding  Postmenopausal bleeding is any bleeding that a woman has after she has entered into menopause. Menopause is the end of a woman's fertile years. After menopause, a woman no longer ovulates and does not have menstrual periods. Postmenopausal bleeding may have various causes, including:  Menopausal hormone therapy (MHT).  Endometrial atrophy. After menopause, low estrogen hormone levels cause the membrane that lines the uterus (endometrium) to become thinner. You may have bleeding as the endometrium thins.  Endometrial hyperplasia. This condition is caused by excess estrogen hormones and low levels of progesterone hormones. The excess estrogen causes the endometrium to thicken, which can lead to bleeding. In some cases, this can lead to cancer of the uterus.  Endometrial cancer.  Non-cancerous growths (polyps) on the endometrium, the lining of the uterus, or the cervix.  Uterine fibroids. These are non-cancerous growths in or around the uterus muscle tissue that can cause heavy bleeding. Any type of postmenopausal bleeding, even if it appears to be a typical menstrual period, should be evaluated by your health care provider. Treatment will depend on the cause of the bleeding. Follow these instructions at home:  Pay attention to any changes in your symptoms.  Avoid using tampons and douches as told by your health care provider.  Change your pads regularly.  Get regular pelvic exams and Pap tests.  Take iron supplements as told by your health care provider.  Take over-the-counter and prescription medicines  only as told by your health care provider.  Keep all follow-up visits as told by your health care provider. This is important. Contact a health care provider if:  Your bleeding lasts more than 1  week.  You have abdominal pain.  You have bleeding with or after sexual intercourse.  You have bleeding that happens more often than every 3 weeks. Get help right away if:  You have a fever, chills, headache, dizziness, muscle aches, and bleeding.  You have severe pain with bleeding.  You are passing blood clots.  You have heavy bleeding, need more than 1 pad an hour, and have never experienced this before.  You feel faint. Summary  Postmenopausal bleeding is any bleeding that a woman has after she has entered into menopause.  Postmenopausal bleeding may have various causes. Treatment will depend on the cause of the bleeding.  Any type of postmenopausal bleeding, even if it appears to be a typical menstrual period, should be evaluated by your health care provider.  Be sure to pay attention to any changes in your symptoms and keep all follow-up visits as told by your health care provider. This information is not intended to replace advice given to you by your health care provider. Make sure you discuss any questions you have with your health care provider. Document Revised: 09/11/2017 Document Reviewed: 08/28/2016 Elsevier Patient Education  [place].

## 2019-10-05 NOTE — Progress Notes (Signed)
   Patient ID: Victoria Baldwin, female   DOB: 09-08-1970, 49 y.o.   MRN: [id number]  Reason for Consult: Endometriosis   Referred by Victoria Pilar, MD  Subjective:     HPI:  Victoria Baldwin is a 49 y.o. female. She reports postmenopausal bleeding  Gynecological History Menarche: 8th or 8th grade Menopause: 50 LMP: 2016 Describes periods as regular, monthly, heavy flow, denies dysmenorrhea Last pap smear: 08/14/2019- NIL HPV negative Last Mammogram: 08/31/2019 [facility]1 History of STDs: Denies Sexually Active: yes, denies pain or bleeding with intercourse  Obstetrical History [id number] 1999, full term vaginal delivery- Female, uncomplicated  No past medical history on file. Family History  Problem Relation Age of Onset  . Breast cancer Maternal Aunt 58  . Breast cancer Maternal Grandmother 80  . Breast cancer Paternal Grandmother 109  . Breast cancer Cousin 80       mat cousin   Past Surgical History:  Procedure Laterality Date  . NO PAST SURGERIES      Short Social History:  Social History   Tobacco Use  . Smoking status: Never Smoker  . Smokeless tobacco: Never Used  Substance Use Topics  . Alcohol use: Yes    Allergies  Allergen Reactions  . Steri-Strip Compound Benzoin [Benzoin Compound]   . Adhesive [Tape] Rash    Current Outpatient Medications  Medication Sig Dispense Refill  . sertraline (ZOLOFT) 50 MG tablet Take 50 mg by mouth daily.     No current facility-administered medications for this visit.    REVIEW OF SYSTEMS      Objective:  Objective   Vitals:   10/05/19 1024  BP: 100/68  Weight: 144 lb (65.3 kg)  Height: 5\' 7"  (1.702 m)   Body mass index is 22.55 kg/m.  Physical Exam   Endometrial Biopsy After discussion with the patient regarding her abnormal uterine bleeding I recommended that she proceed with an endometrial biopsy for further diagnosis. The risks, benefits, alternatives, and indications for an endometrial  biopsy were discussed with the patient in detail. She understood the risks including infection, bleeding, cervical laceration and uterine perforation.  Verbal consent was obtained.   PROCEDURE NOTE:  [place]elle endometrial biopsy was performed using aseptic technique with iodine preparation.  The uterus was sounded to a length of 8 cm.  Adequate sampling was obtained with minimal blood loss.  The patient tolerated the procedure well.  Disposition will be pending pathology.      Assessment/Plan:     49 yo with postmenopausal bleeding and thickened endometrial  EMB performed today     [place], [place] 10/05/2019 11:16 AM

## 2019-10-06 LAB — SURGICAL PATHOLOGY

## 2019-10-27 ENCOUNTER — Other Ambulatory Visit: Payer: Self-pay | Admitting: Obstetrics and Gynecology

## 2019-10-28 ENCOUNTER — Other Ambulatory Visit: Payer: Self-pay | Admitting: Obstetrics and Gynecology

## 2019-10-28 ENCOUNTER — Other Ambulatory Visit: Payer: BC Managed Care – PPO

## 2019-10-28 DIAGNOSIS — N951 Menopausal and female climacteric states: Secondary | ICD-10-CM

## 2019-10-28 NOTE — Telephone Encounter (Signed)
Called and spoke with patient. She will come in for labs. Likely perimenopausal bleeding. Reviewed result with patient.

## 2019-10-30 LAB — FOLLICLE STIMULATING HORMONE: FSH: 55.5 m[IU]/mL

## 2019-10-30 LAB — ESTRADIOL: Estradiol: 41.6 pg/mL

## 2019-11-04 ENCOUNTER — Other Ambulatory Visit: Payer: BC Managed Care – PPO

## 2019-11-05 ENCOUNTER — Telehealth: Payer: Self-pay | Admitting: Obstetrics and Gynecology

## 2019-11-05 NOTE — Telephone Encounter (Signed)
Patient is calling for labs results. Please advise. 

## 2019-11-11 ENCOUNTER — Telehealth: Payer: Self-pay | Admitting: Obstetrics and Gynecology

## 2019-11-11 NOTE — Telephone Encounter (Signed)
Called pt to schedule Hysterscopy D&C with [facility]    DOS 6/17  H&P N/A - consents on DOS per CRS   Covid testing 6/15 @ 8-10:30, [address], drive up and wear mask. Advised pt to quarantine until DOS.  Pre-admit phone call appointment to be requested - date and time will be included on H&P paper work. Also all appointments will be updated on pt MyChart. Explained that this appointment has a call window. Based on the time scheduled will indicate if the call will be received within a 4 hour window before 1:00 or after.  Advised that pt may also receive calls from the hospital pharmacy and pre-service center.  Confirmed pt has [employer] as Chartered certified accountant. No secondary insurance.

## 2019-11-11 NOTE — Telephone Encounter (Signed)
-----   Message from Homero Fellers, MD sent at 11/05/2019  5:21 PM EDT ----- Surgery Booking Request Patient Full Name:  Victoria Baldwin  MRN: [id number]  DOB: 05-29-1971  Surgeon: Homero Fellers, MD  Requested Surgery Date and Time: June or July 2021 Primary Diagnosis AND Code: post menopausal bleeding Secondary Diagnosis and Code:  Surgical Procedure: Hysteroscopy D&C L&D Notification: No Admission Status: same day surgery Length of Surgery: 1 hour Special Case Needs: No H&P: No Phone Interview???:  Yes Interpreter: No Language:  Medical Clearance:  No Special Scheduling Instructions: can sign consents day of surgery Any known health/anesthesia issues, diabetes, sleep apnea, latex allergy, defibrillator/pacemaker?: No Acuity: P3   (P1 highest, P2 delay may cause harm, P3 low, elective gyn, P4 lowest)

## 2019-11-24 ENCOUNTER — Other Ambulatory Visit: Payer: Self-pay

## 2019-11-24 ENCOUNTER — Encounter
Admission: RE | Admit: 2019-11-24 | Discharge: 2019-11-24 | Disposition: A | Discharge status: Home / Self Care | Payer: BC Managed Care – PPO | Source: Ambulatory Visit | Attending: Obstetrics and Gynecology | Admitting: Obstetrics and Gynecology

## 2019-11-24 HISTORY — DX: Anxiety disorder, unspecified: F41.9

## 2019-11-24 NOTE — Patient Instructions (Signed)
Your procedure is scheduled on: 12/03/19 Report to [facility]. To find out your arrival time please call [phone] between 1PM - 3PM on 12/02/19.  Remember: Instructions that are not followed completely may result in serious medical risk, up to and including death, or upon the discretion of your surgeon and anesthesiologist your surgery may need to be rescheduled.     _X__ 1. Do not eat food after midnight the night before your procedure.                 No gum chewing or hard candies. You may drink clear liquids up to 2 hours                 before you are scheduled to arrive for your surgery- DO not drink clear                 liquids within 2 hours of the start of your surgery.                 Clear Liquids include:  water, apple juice without pulp, clear carbohydrate                 drink such as Clearfast or Gatorade, Black Coffee or Tea (Do not add                 anything to coffee or tea). Diabetics water only  __X__2.  On the morning of surgery brush your teeth with toothpaste and water, you                 may rinse your mouth with mouthwash if you wish.  Do not swallow any              toothpaste of mouthwash.     _X__ 3.  No Alcohol for 24 hours before or after surgery.   _X__ 4.  Do Not Smoke or use e-cigarettes For 24 Hours Prior to Your Surgery.                 Do not use any chewable tobacco products for at least 6 hours prior to                 surgery.  ____  5.  Bring all medications with you on the day of surgery if instructed.   __X__  6.  Notify your doctor if there is any change in your medical condition      (cold, fever, infections).     Do not wear jewelry, make-up, hairpins, clips or nail polish. Do not wear lotions, powders, or perfumes.  Do not shave 48 hours prior to surgery. Men may shave face and neck. Do not bring valuables to the hospital.    [facility] is not responsible for any belongings or  valuables.  Contacts, dentures/partials or body piercings may not be worn into surgery. Bring a case for your contacts, glasses or hearing aids, a denture cup will be supplied. Leave your suitcase in the car. After surgery it may be brought to your room. For patients admitted to the hospital, discharge time is determined by your treatment team.   Patients discharged the day of surgery will not be allowed to drive home.   Please read over the following fact sheets that you were given:   MRSA Information  __X__ Take these medicines the morning of surgery with A SIP OF WATER:  1. sertraline (ZOLOFT) 50 MG tablet  2.   3.   4.  5.  6.  ____ Fleet Enema (as directed)   ____ Use CHG Soap/SAGE wipes as directed  ____ Use inhalers on the day of surgery  ____ Stop metformin/Janumet/Farxiga 2 days prior to surgery    ____ Take 1/2 of usual insulin dose the night before surgery. No insulin the morning          of surgery.   ____ Stop Blood Thinners Coumadin/Plavix/Xarelto/Pleta/Pradaxa/Eliquis/Effient/Aspirin  on   Or contact your Surgeon, Cardiologist or Medical Doctor regarding  ability to stop your blood thinners  __X__ Stop Anti-inflammatories 7 days before surgery such as Advil, Ibuprofen, Motrin,  BC or Goodies Powder, Naprosyn, Naproxen, Aleve, Aspirin    __X__ Stop all herbal supplements, fish oil or vitamin E until after surgery.    ____ Bring C-Pap to the hospital.

## 2019-12-01 ENCOUNTER — Other Ambulatory Visit
Admission: RE | Admit: 2019-12-01 | Discharge: 2019-12-01 | Disposition: A | Discharge status: Home / Self Care | Payer: BC Managed Care – PPO | Source: Ambulatory Visit | Attending: Obstetrics and Gynecology | Admitting: Obstetrics and Gynecology

## 2019-12-01 ENCOUNTER — Other Ambulatory Visit: Payer: Self-pay

## 2019-12-01 DIAGNOSIS — Z20822 Contact with and (suspected) exposure to covid-19: Secondary | ICD-10-CM | POA: Diagnosis not present

## 2019-12-01 DIAGNOSIS — Z01812 Encounter for preprocedural laboratory examination: Secondary | ICD-10-CM | POA: Insufficient documentation

## 2019-12-01 LAB — TYPE AND SCREEN
ABO/RH(D): O POS
Antibody Screen: NEGATIVE

## 2019-12-01 LAB — SARS CORONAVIRUS 2 (TAT 6-24 HRS): SARS Coronavirus 2: NEGATIVE

## 2019-12-01 LAB — CBC
HCT: 41.4 % (ref 36.0–46.0)
Hemoglobin: 13.6 g/dL (ref 12.0–15.0)
MCH: 30 pg (ref 26.0–34.0)
MCHC: 32.9 g/dL (ref 30.0–36.0)
MCV: 91.2 fL (ref 80.0–100.0)
Platelets: 288 10*3/uL (ref 150–400)
RBC: 4.54 MIL/uL (ref 3.87–5.11)
RDW: 12.9 % (ref 11.5–15.5)
WBC: 8.3 10*3/uL (ref 4.0–10.5)
nRBC: 0 % (ref 0.0–0.2)

## 2019-12-03 ENCOUNTER — Ambulatory Visit: Payer: BC Managed Care – PPO | Admitting: Anesthesiology

## 2019-12-03 ENCOUNTER — Other Ambulatory Visit: Payer: Self-pay

## 2019-12-03 ENCOUNTER — Encounter
Admission: RE | Disposition: A | Discharge status: Home / Self Care | Payer: Self-pay | Source: Home / Self Care | Attending: Obstetrics and Gynecology

## 2019-12-03 ENCOUNTER — Encounter: Payer: Self-pay | Admitting: Obstetrics and Gynecology

## 2019-12-03 ENCOUNTER — Ambulatory Visit
Admission: RE | Admit: 2019-12-03 | Discharge: 2019-12-03 | Disposition: A | Discharge status: Home / Self Care | Payer: BC Managed Care – PPO | Attending: Obstetrics and Gynecology | Admitting: Obstetrics and Gynecology

## 2019-12-03 DIAGNOSIS — N950 Postmenopausal bleeding: Secondary | ICD-10-CM | POA: Diagnosis not present

## 2019-12-03 DIAGNOSIS — N939 Abnormal uterine and vaginal bleeding, unspecified: Secondary | ICD-10-CM | POA: Diagnosis not present

## 2019-12-03 DIAGNOSIS — F419 Anxiety disorder, unspecified: Secondary | ICD-10-CM | POA: Diagnosis not present

## 2019-12-03 DIAGNOSIS — Z79899 Other long term (current) drug therapy: Secondary | ICD-10-CM | POA: Diagnosis not present

## 2019-12-03 DIAGNOSIS — Z803 Family history of malignant neoplasm of breast: Secondary | ICD-10-CM | POA: Insufficient documentation

## 2019-12-03 HISTORY — PX: HYSTEROSCOPY WITH D & C: SHX1775

## 2019-12-03 LAB — ABO/RH: ABO/RH(D): O POS

## 2019-12-03 SURGERY — DILATATION AND CURETTAGE /HYSTEROSCOPY
Anesthesia: General | Site: Uterus

## 2019-12-03 MED ORDER — DEXAMETHASONE SODIUM PHOSPHATE 10 MG/ML IJ SOLN
INTRAMUSCULAR | Status: DC | PRN
  Administered 2019-12-03: 10 mg via INTRAVENOUS

## 2019-12-03 MED ORDER — LACTATED RINGERS IV SOLN: INTRAVENOUS | Status: DC

## 2019-12-03 MED ORDER — FENTANYL CITRATE (PF) 100 MCG/2ML IJ SOLN: 25.0000 ug | INTRAMUSCULAR | Status: DC | PRN

## 2019-12-03 MED ORDER — ACETAMINOPHEN 10 MG/ML IV SOLN
INTRAVENOUS | Status: AC
  Filled 2019-12-03: qty 100

## 2019-12-03 MED ORDER — ACETAMINOPHEN 325 MG PO TABS: 325.0000 mg | ORAL_TABLET | ORAL | Status: DC | PRN

## 2019-12-03 MED ORDER — FENTANYL CITRATE (PF) 100 MCG/2ML IJ SOLN
INTRAMUSCULAR | Status: AC
  Filled 2019-12-03: qty 2

## 2019-12-03 MED ORDER — ONDANSETRON HCL 4 MG/2ML IJ SOLN
INTRAMUSCULAR | Status: DC | PRN
  Administered 2019-12-03: 4 mg via INTRAVENOUS

## 2019-12-03 MED ORDER — FAMOTIDINE 20 MG PO TABS
ORAL_TABLET | ORAL | Status: AC
  Administered 2019-12-03: 20 mg via ORAL
  Filled 2019-12-03: qty 1

## 2019-12-03 MED ORDER — GLYCOPYRROLATE 0.2 MG/ML IJ SOLN
INTRAMUSCULAR | Status: DC | PRN
  Administered 2019-12-03: .2 mg via INTRAVENOUS

## 2019-12-03 MED ORDER — PROMETHAZINE HCL 25 MG/ML IJ SOLN
6.2500 mg | INTRAMUSCULAR | Status: DC | PRN
  Administered 2019-12-03: 6.25 mg via INTRAVENOUS

## 2019-12-03 MED ORDER — LIDOCAINE HCL (CARDIAC) PF 100 MG/5ML IV SOSY
PREFILLED_SYRINGE | INTRAVENOUS | Status: DC | PRN
  Administered 2019-12-03: 100 mg via INTRAVENOUS

## 2019-12-03 MED ORDER — CHLORHEXIDINE GLUCONATE 0.12 % MT SOLN: 15.0000 mL | Freq: Once | OROMUCOSAL | Status: AC

## 2019-12-03 MED ORDER — KETOROLAC TROMETHAMINE 30 MG/ML IJ SOLN
30.0000 mg | Freq: Once | INTRAMUSCULAR | Status: AC | PRN
  Administered 2019-12-03: 30 mg via INTRAVENOUS

## 2019-12-03 MED ORDER — CHLORHEXIDINE GLUCONATE 0.12 % MT SOLN
OROMUCOSAL | Status: AC
  Administered 2019-12-03: 15 mL via OROMUCOSAL
  Filled 2019-12-03: qty 15

## 2019-12-03 MED ORDER — ACETAMINOPHEN 10 MG/ML IV SOLN
INTRAVENOUS | Status: DC | PRN
  Administered 2019-12-03: 1000 mg via INTRAVENOUS

## 2019-12-03 MED ORDER — SODIUM CHLORIDE FLUSH 0.9 % IV SOLN
INTRAVENOUS | Status: AC
  Filled 2019-12-03: qty 10

## 2019-12-03 MED ORDER — ORAL CARE MOUTH RINSE: 15.0000 mL | Freq: Once | OROMUCOSAL | Status: AC

## 2019-12-03 MED ORDER — MIDAZOLAM HCL 2 MG/2ML IJ SOLN
INTRAMUSCULAR | Status: AC
  Filled 2019-12-03: qty 2

## 2019-12-03 MED ORDER — OXYMETAZOLINE HCL 0.05 % NA SOLN
NASAL | Status: DC | PRN
Start: 2019-12-03 — End: 2019-12-03
  Administered 2019-12-03: 2 via NASAL

## 2019-12-03 MED ORDER — PHENYLEPHRINE HCL (PRESSORS) 10 MG/ML IV SOLN
INTRAVENOUS | Status: DC | PRN
  Administered 2019-12-03 (×2): 100 ug via INTRAVENOUS

## 2019-12-03 MED ORDER — IBUPROFEN 600 MG PO TABS: 600.0000 mg | ORAL_TABLET | Freq: Four times a day (QID) | ORAL | 0 refills | Status: DC | PRN

## 2019-12-03 MED ORDER — MEPERIDINE HCL 50 MG/ML IJ SOLN: 6.2500 mg | INTRAMUSCULAR | Status: DC | PRN

## 2019-12-03 MED ORDER — PROMETHAZINE HCL 25 MG/ML IJ SOLN
INTRAMUSCULAR | Status: AC
  Filled 2019-12-03: qty 1

## 2019-12-03 MED ORDER — FAMOTIDINE 20 MG PO TABS: 20.0000 mg | ORAL_TABLET | Freq: Once | ORAL | Status: AC

## 2019-12-03 MED ORDER — KETOROLAC TROMETHAMINE 30 MG/ML IJ SOLN
INTRAMUSCULAR | Status: AC
  Filled 2019-12-03: qty 1

## 2019-12-03 MED ORDER — PROPOFOL 10 MG/ML IV BOLUS
INTRAVENOUS | Status: DC | PRN
  Administered 2019-12-03 (×2): 150 mg via INTRAVENOUS

## 2019-12-03 MED ORDER — ACETAMINOPHEN 160 MG/5ML PO SOLN
325.0000 mg | ORAL | Status: DC | PRN
  Filled 2019-12-03: qty 20.3

## 2019-12-03 MED ORDER — FENTANYL CITRATE (PF) 100 MCG/2ML IJ SOLN
INTRAMUSCULAR | Status: DC | PRN
  Administered 2019-12-03 (×2): 50 ug via INTRAVENOUS

## 2019-12-03 MED ORDER — SILVER NITRATE-POT NITRATE 75-25 % EX MISC
CUTANEOUS | Status: AC
  Filled 2019-12-03: qty 10

## 2019-12-03 MED ORDER — ACETAMINOPHEN 500 MG PO TABS
1000.0000 mg | ORAL_TABLET | Freq: Four times a day (QID) | ORAL | 0 refills | Status: AC | PRN
Start: 2019-12-03 — End: 2020-12-02

## 2019-12-03 MED ORDER — MIDAZOLAM HCL 2 MG/2ML IJ SOLN
INTRAMUSCULAR | Status: DC | PRN
  Administered 2019-12-03: 2 mg via INTRAVENOUS

## 2019-12-03 MED ORDER — SUCCINYLCHOLINE CHLORIDE 20 MG/ML IJ SOLN
INTRAMUSCULAR | Status: DC | PRN
  Administered 2019-12-03: 100 mg via INTRAVENOUS

## 2019-12-03 MED ORDER — SODIUM CHLORIDE 0.9 % IR SOLN
Status: DC | PRN
  Administered 2019-12-03: 3000 mL

## 2019-12-03 MED ORDER — OXYMETAZOLINE HCL 0.05 % NA SOLN
NASAL | Status: AC
  Filled 2019-12-03: qty 30

## 2019-12-03 MED ORDER — HYDROCODONE-ACETAMINOPHEN 7.5-325 MG PO TABS
1.0000 | ORAL_TABLET | Freq: Once | ORAL | Status: DC | PRN
  Filled 2019-12-03: qty 1

## 2019-12-03 SURGICAL SUPPLY — 18 items
CATH ROBINSON RED A/P 16FR (CATHETERS) ×3 IMPLANT
DEVICE MYOSURE LITE (MISCELLANEOUS) IMPLANT
DEVICE MYOSURE REACH (MISCELLANEOUS) IMPLANT
ELECT REM PT RETURN 9FT ADLT (ELECTROSURGICAL)
ELECTRODE REM PT RTRN 9FT ADLT (ELECTROSURGICAL) IMPLANT
GLOVE BIOGEL PI IND STRL 6.5 (GLOVE) ×2 IMPLANT
GLOVE BIOGEL PI INDICATOR 6.5 (GLOVE) ×4
GLOVE SURG SYN 6.5 ES PF (GLOVE) ×3 IMPLANT
GOWN STRL REUS W/ TWL LRG LVL3 (GOWN DISPOSABLE) ×2 IMPLANT
GOWN STRL REUS W/TWL LRG LVL3 (GOWN DISPOSABLE) ×4
KIT PROCEDURE FLUENT (KITS) IMPLANT
PACK DNC HYST (MISCELLANEOUS) ×3 IMPLANT
PAD OB MATERNITY 4.3X12.25 (PERSONAL CARE ITEMS) ×3 IMPLANT
PAD PREP 24X41 OB/GYN DISP (PERSONAL CARE ITEMS) ×3 IMPLANT
SEAL ROD LENS SCOPE MYOSURE (ABLATOR) ×3 IMPLANT
SOL .9 NS 3000ML IRR  AL (IV SOLUTION) ×2
SOL .9 NS 3000ML IRR UROMATIC (IV SOLUTION) ×1 IMPLANT
TOWEL OR 17X26 4PK STRL BLUE (TOWEL DISPOSABLE) ×3 IMPLANT

## 2019-12-03 NOTE — Discharge Instructions (Signed)
Hysteroscopy, Care After This sheet gives you information about how to care for yourself after your procedure. Your health care provider may also give you more specific instructions. If you have problems or questions, contact your health care provider. What can I expect after the procedure? After the procedure, it is common to have:  Cramping.  Bleeding. This can vary from light spotting to menstrual-like bleeding. Follow these instructions at home: Activity  Rest for 1-2 days after the procedure.  Do not douche, use tampons, or have sex for 2 weeks after the procedure, or until your health care provider approves.  Do not drive for 24 hours after the procedure, or for as long as told by your health care provider.  Do not drive, use heavy machinery, or drink alcohol while taking prescription pain medicines. Medicines   Take over-the-counter and prescription medicines only as told by your health care provider.  Do not take aspirin during recovery. It can increase the risk of bleeding. General instructions  Do not take baths, swim, or use a hot tub until your health care provider approves. Take showers instead of baths for 2 weeks, or for as long as told by your health care provider.  To prevent or treat constipation while you are taking prescription pain medicine, your health care provider may recommend that you: ? Drink enough fluid to keep your urine clear or pale yellow. ? Take over-the-counter or prescription medicines. ? Eat foods that are high in fiber, such as fresh fruits and vegetables, whole grains, and beans. ? Limit foods that are high in fat and processed sugars, such as fried and sweet foods.  Keep all follow-up visits as told by your health care provider. This is important. Contact a health care provider if:  You feel dizzy or lightheaded.  You feel nauseous.  You have abnormal vaginal discharge.  You have a rash.  You have pain that does not get better with  medicine.  You have chills. Get help right away if:  You have bleeding that is heavier than a normal menstrual period.  You have a fever.  You have pain or cramps that get worse.  You develop new abdominal pain.  You faint.  You have pain in your shoulders.  You have shortness of breath. Summary  After the procedure, you may have cramping and some vaginal bleeding.  Do not douche, use tampons, or have sex for 2 weeks after the procedure, or until your health care provider approves.  Do not take baths, swim, or use a hot tub until your health care provider approves. Take showers instead of baths for 2 weeks, or for as long as told by your health care provider.  Report any unusual symptoms to your health care provider.  Keep all follow-up visits as told by your health care provider. This is important. This information is not intended to replace advice given to you by your health care provider. Make sure you discuss any questions you have with your health care provider. Document Revised: 05/17/2017 Document Reviewed: 07/03/2016 Elsevier Patient Education  2020 Elsevier Inc.  

## 2019-12-03 NOTE — H&P (Signed)
Victoria Baldwin is an 49 y.o. female.   Chief Complaint: Abnormal uterine bleeding, post menopausal bleeding  HPI: Patient had an episode of post menopausal bleeding in November or 202. Pelvic US showed 22mm thickened endometrium. Sampling was advised.   Past Medical History:  Diagnosis Date  . Anxiety     Past Surgical History:  Procedure Laterality Date  . BUNIONECTOMY Left     Family History  Problem Relation Age of Onset  . Breast cancer Maternal Aunt 58  . Breast cancer Maternal Grandmother 80  . Breast cancer Paternal Grandmother 27  . Breast cancer Cousin 30       mat cousin   Social History:  reports that she has never smoked. She has never used smokeless tobacco. She reports current alcohol use of about 4.0 standard drinks of alcohol per week. She reports that she does not use drugs.  Allergies:  Allergies  Allergen Reactions  . Adhesive [Tape] Rash  . Steri-Strip Compound Benzoin [Benzoin Compound] Rash    Medications Prior to Admission  Medication Sig Dispense Refill  . ibuprofen (ADVIL) 200 MG tablet Take 400 mg by mouth every 8 (eight) hours as needed (for pain.).    Marland Kitchen Multiple Vitamin (MULTIVITAMIN WITH MINERALS) TABS tablet Take 1 tablet by mouth daily.    . sertraline (ZOLOFT) 50 MG tablet Take 50 mg by mouth daily.      Results for orders placed or performed during the hospital encounter of 12/03/19 (from the past 48 hour(s))  ABO/Rh     Status: None   Collection Time: 12/03/19 11:11 AM  Result Value Ref Range   ABO/RH(D)      O POS Performed at [facility], [address] [place], [place] [address]    No results found.  Review of Systems  Constitutional: Negative for chills and fever.  HENT: Negative for congestion, hearing loss and sinus pain.   Respiratory: Negative for cough, shortness of breath and wheezing.   Cardiovascular: Negative for chest pain, palpitations and leg swelling.  Gastrointestinal: Negative for abdominal  pain, constipation, diarrhea, nausea and vomiting.  Genitourinary: Negative for dysuria, flank pain, frequency, hematuria and urgency.  Musculoskeletal: Negative for back pain.  Skin: Negative for rash.  Neurological: Negative for dizziness and headaches.  Psychiatric/Behavioral: Negative for suicidal ideas. The patient is not nervous/anxious.     Blood pressure 127/69, pulse 77, temperature 98.1 F (36.7 C), temperature source Temporal, resp. rate 18, last menstrual period 12/01/2014, SpO2 100 %. Physical Exam  Nursing note and vitals reviewed. Constitutional: She is oriented to person, place, and time. She appears well-developed.  HENT:  Head: Normocephalic and atraumatic.  Cardiovascular: Normal rate and regular rhythm.  Respiratory: Effort normal and breath sounds normal.  GI: Soft. Bowel sounds are normal.  Musculoskeletal:        General: Normal range of motion.  Neurological: She is alert and oriented to person, place, and time.  Skin: Skin is warm and dry.  Psychiatric: Her behavior is normal. Judgment and thought content normal.     Assessment/Plan 49 yo with postmenopausal bleeding and thickened endometrium Have discussed risks of hysteroscopy D&C. Patient desires to proceed.   Adrian Prows MD, [facility], [facility] 12/03/2019 12:44 PM

## 2019-12-03 NOTE — OR Nursing (Signed)
Patient arrived to [facility] coughing. She reports she is at the tail end of a cold. She reports she has not been coughing much but something here at the hospital got her started coughing. Breath sounds are clear throughout. Dr. Antony Salmon has evaluated her and reports she is okay to proceed with surgery. Coughing has mostly stopped at this time.

## 2019-12-03 NOTE — Anesthesia Preprocedure Evaluation (Signed)
Anesthesia Evaluation  Patient identified by MRN, date of birth, ID band Patient awake    Reviewed: Allergy & Precautions, H&P , NPO status , reviewed documented beta blocker date and time   Airway Mallampati: I  TM Distance: >3 FB Neck ROM: full    Dental  (+) Teeth Intact   Pulmonary   Pt had "coughing spell" when arrive to hosp. Non-productive. Entirely normal exam. Pt felt 2 to anxiety. No other pulmonary C/O Pulmonary exam normal breath sounds clear to auscultation       Cardiovascular Normal cardiovascular exam     Neuro/Psych PSYCHIATRIC DISORDERS Anxiety    GI/Hepatic neg GERD  ,  Endo/Other    Renal/GU      Musculoskeletal   Abdominal   Peds  Hematology   Anesthesia Other Findings Past Medical History: No date: Anxiety Past Surgical History: No date: BUNIONECTOMY; Left   Reproductive/Obstetrics                             Anesthesia Physical Anesthesia Plan  ASA: II  Anesthesia Plan: General   Post-op Pain Management:    Induction: Intravenous  PONV Risk Score and Plan: Ondansetron and Treatment may vary due to age or medical condition  Airway Management Planned: LMA  Additional Equipment:   Intra-op Plan:   Post-operative Plan: Extubation in OR  Informed Consent: I have reviewed the patients History and Physical, chart, labs and discussed the procedure including the risks, benefits and alternatives for the proposed anesthesia with the patient or authorized representative who has indicated his/her understanding and acceptance.     Dental Advisory Given  Plan Discussed with: CRNA  Anesthesia Plan Comments:         Anesthesia Quick Evaluation

## 2019-12-03 NOTE — Anesthesia Procedure Notes (Signed)
Procedure Name: Intubation Performed by: Kelton Pillar, CRNA Pre-anesthesia Checklist: Patient identified, Emergency Drugs available, Suction available and Patient being monitored Patient Re-evaluated:Patient Re-evaluated prior to induction Oxygen Delivery Method: Circle system utilized Preoxygenation: Pre-oxygenation with 100% oxygen Induction Type: IV induction Ventilation: Mask ventilation without difficulty Laryngoscope Size: McGraph and 3 Grade View: Grade I Tube type: Oral Tube size: 6.5 mm Number of attempts: 1 Airway Equipment and Method: Stylet and Oral airway Placement Confirmation: ETT inserted through vocal cords under direct vision,  positive ETCO2 and breath sounds checked- equal and bilateral Secured at: 21 cm Tube secured with: Tape Dental Injury: Teeth and Oropharynx as per pre-operative assessment

## 2019-12-03 NOTE — Transfer of Care (Signed)
Immediate Anesthesia Transfer of Care Note  Patient: Victoria Baldwin  Procedure(s) Performed: DILATATION AND CURETTAGE /HYSTEROSCOPY (N/A Uterus)  Patient Location: PACU  Anesthesia Type:General  Level of Consciousness: drowsy and responds to stimulation  Airway & Oxygen Therapy: Patient Spontanous Breathing and Patient connected to face mask oxygen  Post-op Assessment: Report given to RN and Post -op Vital signs reviewed and stable  Post vital signs: Reviewed and stable  Last Vitals:  Vitals Value Taken Time  BP 131/90 12/03/19 1348  Temp    Pulse 100 12/03/19 1354  Resp 12 12/03/19 1354  SpO2 100 % 12/03/19 1354  Vitals shown include unvalidated device data.  Last Pain:  Vitals:   12/03/19 1348  TempSrc:   PainSc: Asleep         Complications: No complications documented.

## 2019-12-03 NOTE — Op Note (Signed)
Operative Note  12/03/2019  PRE-OP DIAGNOSIS: Abnormal uterine bleeding  POST-OP DIAGNOSIS: same   SURGEON: Keysha Damewood MD  PROCEDURE: Procedure(s): DILATATION AND CURETTAGE /HYSTEROSCOPY   ANESTHESIA: Choice   ESTIMATED BLOOD LOSS: 5 cc   SPECIMENS:  Endometrial curettings  FLUID DEFICIT: 536 cc  COMPLICATIONS: None  DISPOSITION: PACU - hemodynamically stable.  CONDITION: stable  FINDINGS: Exam under anesthesia revealed  8cm uterus and bilateral adnexa without masses or fullness. Hysteroscopy revealed normal uterine cavity with bilateral tubal ostia and normal appearing endocervical canal.  PROCEDURE IN DETAIL: After informed consent was obtained, the patient was taken to the operating room where anesthesia was obtained without difficulty. The patient was positioned in the dorsal lithotomy position in [place]. The patient's bladder was catheterized with an in and out foley catheter. The patient was examined under anesthesia, with the above noted findings. The weightedspeculum was placed inside the patient's vagina, and the the anterior lip of the cervix was seen and grasped with the tenaculum.  The uterine cavity was sounded to 8cm, and then the cervix was progressively dilated to a 14 French-Pratt dilator. The 0 degree hysteroscope was introduced, with saline fluid used to distend the intrauterine cavity, with the above noted findings.  The hysteroscope was removed.  The uterine cavity was curetted until a gritty texture was noted, yielding endometrial curettings. All instruments were removed, with excellent hemostasis noted throughout. She was then taken out of dorsal lithotomy. Minimal discrepancy in fluid was noted.  The patient tolerated the procedure well. Sponge, lap and needle counts were correct x2. The patient was taken to recovery room in excellent condition.  Adrian Prows MD [facility], [facility] 12/03/2019 1:36 PM

## 2019-12-04 ENCOUNTER — Encounter: Payer: Self-pay | Admitting: Obstetrics and Gynecology

## 2019-12-07 LAB — SURGICAL PATHOLOGY

## 2019-12-10 ENCOUNTER — Telehealth: Payer: Self-pay | Admitting: Obstetrics and Gynecology

## 2019-12-10 NOTE — Telephone Encounter (Signed)
-----   Message from Homero Fellers, MD sent at 12/09/2019  6:03 PM EDT ----- This patient would like to cancel post op visit on Friday of this week.  Thank you,  Dr. Gilman Schmidt

## 2019-12-10 NOTE — Telephone Encounter (Signed)
Appointment cancelled

## 2019-12-10 NOTE — Anesthesia Postprocedure Evaluation (Signed)
Anesthesia Post Note  Patient: Cornelia Walraven  Procedure(s) Performed: DILATATION AND CURETTAGE /HYSTEROSCOPY (N/A Uterus)  Patient location during evaluation: PACU Anesthesia Type: General Level of consciousness: awake and alert Pain management: pain level controlled Vital Signs Assessment: post-procedure vital signs reviewed and stable Respiratory status: spontaneous breathing, nonlabored ventilation and respiratory function stable Cardiovascular status: blood pressure returned to baseline and stable Postop Assessment: no apparent nausea or vomiting Anesthetic complications: no   No complications documented.   Last Vitals:  Vitals:   12/03/19 1506 12/03/19 1539  BP: 121/77 114/76  Pulse: 82 83  Resp: 14 16  Temp: 36.4 C   SpO2: 97% 97%    Last Pain:  Vitals:   12/04/19 0805  TempSrc:   PainSc: 0-No pain                 Alphonsus Sias

## 2019-12-11 ENCOUNTER — Ambulatory Visit: Payer: BC Managed Care – PPO | Admitting: Obstetrics and Gynecology

## 2020-11-29 ENCOUNTER — Other Ambulatory Visit: Payer: Self-pay | Admitting: Physician Assistant

## 2020-11-29 DIAGNOSIS — Z1231 Encounter for screening mammogram for malignant neoplasm of breast: Secondary | ICD-10-CM

## 2020-12-12 ENCOUNTER — Other Ambulatory Visit: Payer: Self-pay

## 2020-12-12 ENCOUNTER — Ambulatory Visit
Admission: RE | Admit: 2020-12-12 | Discharge: 2020-12-12 | Disposition: A | Discharge status: Home / Self Care | Payer: BC Managed Care – PPO | Source: Ambulatory Visit | Attending: Physician Assistant | Admitting: Physician Assistant

## 2020-12-12 DIAGNOSIS — Z1231 Encounter for screening mammogram for malignant neoplasm of breast: Secondary | ICD-10-CM | POA: Diagnosis not present

## 2021-11-22 ENCOUNTER — Encounter: Payer: Self-pay | Admitting: Podiatrist

## 2021-11-22 ENCOUNTER — Ambulatory Visit (INDEPENDENT_AMBULATORY_CARE_PROVIDER_SITE_OTHER): Payer: BC Managed Care – PPO

## 2021-11-22 ENCOUNTER — Ambulatory Visit: Payer: BC Managed Care – PPO | Admitting: Podiatrist

## 2021-11-22 DIAGNOSIS — M2062 Acquired deformities of toe(s), unspecified, left foot: Secondary | ICD-10-CM

## 2021-11-22 DIAGNOSIS — G588 Other specified mononeuropathies: Secondary | ICD-10-CM

## 2021-11-22 NOTE — Progress Notes (Signed)
Chief Complaint  Patient presents with   Foot Pain    I had surgery with Dr March Rummage and it is sore and tender and there is some numbness and tingling and sharp shooting pain and it feels like a electric shock     HPI: Patient is 51 y.o. female who presents today for a sharp and numb pain on the left foot between the second and third toes. She relates the pain feels electric at times.  She had bunion surgery in 2020 with Dr. March Rummage and is concerned the hardware may have moved Patient Active Problem List   Diagnosis Date Noted   Postmenopausal bleeding    Abnormal uterine bleeding    Anxiety 01/18/2017   Neck pain 07/19/2014   Tubular adenoma of colon 02/10/2012    Current Outpatient Medications on File Prior to Visit  Medication Sig Dispense Refill   BINAXNOW COVID-19 AG HOME TEST KIT See admin instructions.     ibuprofen (ADVIL) 600 MG tablet Take 1 tablet (600 mg total) by mouth every 6 (six) hours as needed. 60 tablet 0   Multiple Vitamin (MULTIVITAMIN WITH MINERALS) TABS tablet Take 1 tablet by mouth daily.     sertraline (ZOLOFT) 50 MG tablet Take 50 mg by mouth daily.     No current facility-administered medications on file prior to visit.    Allergies  Allergen Reactions   Adhesive [Tape] Rash   Steri-Strip Compound Benzoin [Benzoin Compound] Rash    Review of Systems No fevers, chills, nausea, muscle aches, no difficulty breathing, no calf pain, no chest pain or shortness of breath.   Physical Exam  GENERAL APPEARANCE: Alert, conversant. Appropriately groomed. No acute distress.   VASCULAR: Pedal pulses palpable DP and PT bilateral.  Capillary refill time is immediate to all digits,  Proximal to distal cooling it warm to warm.  Digital perfusion adequate.   NEUROLOGIC: sensation is intact to 5.07 monofilament at 5/5 sites bilateral.  Light touch is intact bilateral, vibratory sensation intact bilateral Neuroma type symptomatology noted second interspace of the left  foot with pressure.   MUSCULOSKELETAL: acceptable muscle strength, tone and stability bilateral.  No gross boney pedal deformities noted.  Bunion corrected via lapidus fusion on the left foot. Slightly shortened first ray is noted compared to the second.   DERMATOLOGIC: skin is warm, supple, and dry.  Color, texture, and turgor of skin within normal limits.  No open wounds are noted.  No preulcerative lesions are seen.  Digital nails are asymptomatic.    X-rays 3 views of the left foot are obtained.  Hardware and fusion at the [place] bunion correction is in good alignment and position.  No swelling is noted.  Shortened first ray is noted in comparison with the second.  No sign of fracture is noted no acute osseous abnormalities are seen.  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  She was happy to know that her surgical site still looks great.  Discussed that the discomfort she is feeling is likely nerve related and I recommended shoe recommendations for her.  I also discussed that we could try injection therapy in the future should the pain get worse she would like to hold off on that at this time.  She will call if the pain does not subside and otherwise will be seen back  as needed for follow-up.

## 2021-11-27 ENCOUNTER — Other Ambulatory Visit: Payer: Self-pay | Admitting: Podiatrist

## 2021-11-27 DIAGNOSIS — G588 Other specified mononeuropathies: Secondary | ICD-10-CM

## 2022-01-23 ENCOUNTER — Emergency Department
Admission: EM | Admit: 2022-01-23 | Discharge: 2022-01-23 | Disposition: A | Discharge status: Home / Self Care | Payer: BC Managed Care – PPO | Attending: Emergency Medicine | Admitting: Emergency Medicine

## 2022-01-23 ENCOUNTER — Other Ambulatory Visit: Payer: Self-pay

## 2022-01-23 DIAGNOSIS — R944 Abnormal results of kidney function studies: Secondary | ICD-10-CM | POA: Insufficient documentation

## 2022-01-23 DIAGNOSIS — R300 Dysuria: Secondary | ICD-10-CM | POA: Diagnosis present

## 2022-01-23 DIAGNOSIS — N390 Urinary tract infection, site not specified: Secondary | ICD-10-CM

## 2022-01-23 DIAGNOSIS — D72829 Elevated white blood cell count, unspecified: Secondary | ICD-10-CM | POA: Diagnosis not present

## 2022-01-23 DIAGNOSIS — R1030 Lower abdominal pain, unspecified: Secondary | ICD-10-CM

## 2022-01-23 LAB — COMPREHENSIVE METABOLIC PANEL
ALT: 8 U/L (ref 0–44)
AST: 18 U/L (ref 15–41)
Albumin: 4.3 g/dL (ref 3.5–5.0)
Alkaline Phosphatase: 80 U/L (ref 38–126)
Anion gap: 5 (ref 5–15)
BUN: 17 mg/dL (ref 6–20)
CO2: 27 mmol/L (ref 22–32)
Calcium: 9.4 mg/dL (ref 8.9–10.3)
Chloride: 106 mmol/L (ref 98–111)
Creatinine, Ser: 1.08 mg/dL — ABNORMAL HIGH (ref 0.44–1.00)
GFR, Estimated: >60 mL/min (ref >60)
Glucose, Bld: 116 mg/dL — ABNORMAL HIGH (ref 70–99)
Potassium: 3.7 mmol/L (ref 3.5–5.1)
Sodium: 138 mmol/L (ref 135–145)
Total Bilirubin: 0.8 mg/dL (ref 0.3–1.2)
Total Protein: 7.3 g/dL (ref 6.5–8.1)

## 2022-01-23 LAB — URINALYSIS, ROUTINE W REFLEX MICROSCOPIC
Bacteria, UA: NONE SEEN
Bilirubin Urine: NEGATIVE
Glucose, UA: NEGATIVE mg/dL
Ketones, ur: NEGATIVE mg/dL
Nitrite: NEGATIVE
Protein, ur: 100 mg/dL — AB
RBC / HPF: >50 RBC/hpf — ABNORMAL HIGH (ref 0–5)
Specific Gravity, Urine: 1.015 (ref 1.005–1.030)
WBC, UA: >50 WBC/hpf — ABNORMAL HIGH (ref 0–5)
pH: 6 (ref 5.0–8.0)

## 2022-01-23 LAB — CBC
HCT: 41.1 % (ref 36.0–46.0)
Hemoglobin: 13.4 g/dL (ref 12.0–15.0)
MCH: 29.8 pg (ref 26.0–34.0)
MCHC: 32.6 g/dL (ref 30.0–36.0)
MCV: 91.5 fL (ref 80.0–100.0)
Platelets: 270 10*3/uL (ref 150–400)
RBC: 4.49 MIL/uL (ref 3.87–5.11)
RDW: 13 % (ref 11.5–15.5)
WBC: 14.6 10*3/uL — ABNORMAL HIGH (ref 4.0–10.5)
nRBC: 0 % (ref 0.0–0.2)

## 2022-01-23 LAB — LIPASE, BLOOD: Lipase: 27 U/L (ref 11–51)

## 2022-01-23 MED ORDER — PHENAZOPYRIDINE HCL 200 MG PO TABS
200.0000 mg | ORAL_TABLET | Freq: Once | ORAL | Status: AC
  Administered 2022-01-23: 200 mg via ORAL
  Filled 2022-01-23: qty 1

## 2022-01-23 MED ORDER — CEPHALEXIN 500 MG PO CAPS: 500.0000 mg | ORAL_CAPSULE | Freq: Three times a day (TID) | ORAL | 0 refills | Status: DC

## 2022-01-23 MED ORDER — PHENAZOPYRIDINE HCL 200 MG PO TABS: 200.0000 mg | ORAL_TABLET | Freq: Three times a day (TID) | ORAL | 0 refills | Status: DC | PRN

## 2022-01-23 MED ORDER — CEPHALEXIN 500 MG PO CAPS
500.0000 mg | ORAL_CAPSULE | Freq: Once | ORAL | Status: AC
  Administered 2022-01-23: 500 mg via ORAL
  Filled 2022-01-23: qty 1

## 2022-01-23 NOTE — Discharge Instructions (Signed)
1.  Take antibiotic as prescribed (Keflex '500mg'$  3 times daily x 7 days). 2.  Take Pyridium for urinary discomfort. 3.  Drink plenty of fluids daily. 4.  Return to the ER for worsening symptoms, persistent vomiting, fever or other concerns

## 2022-01-23 NOTE — ED Provider Notes (Signed)
[facility] Provider Note    Event Date/Time   First MD Initiated Contact with Patient 01/23/22 [id number]     (approximate)   History   Abdominal Pain   HPI  Victoria Baldwin is a 51 y.o. female who presents to the ED from home with a chief complaint of suprapubic pain, urinary frequency or dysuria.  Symptoms x 1 day.  Patient has been to the right every week in the summer.  Denies fever, chills, chest pain, shortness of breath, flank pain, nausea/vomiting/diarrhea.  No history of kidney stones.     Past Medical History   Past Medical History:  Diagnosis Date   Anxiety      Active Problem List   Patient Active Problem List   Diagnosis Date Noted   Postmenopausal bleeding    Abnormal uterine bleeding    Anxiety 01/18/2017   Neck pain 07/19/2014   Tubular adenoma of colon 02/10/2012     Past Surgical History   Past Surgical History:  Procedure Laterality Date   BUNIONECTOMY Left    HYSTEROSCOPY WITH D & C N/A 12/03/2019   Procedure: DILATATION AND CURETTAGE /HYSTEROSCOPY;  Surgeon: Homero Fellers, MD;  Location: [facility] ORS;  Service: Gynecology;  Laterality: N/A;     Home Medications   Prior to Admission medications   Medication Sig Start Date End Date Taking? Authorizing Provider  cephALEXin (KEFLEX) 500 MG capsule Take 1 capsule (500 mg total) by mouth 3 (three) times daily. 01/23/22  Yes Paulette Blanch, MD  phenazopyridine (PYRIDIUM) 200 MG tablet Take 1 tablet (200 mg total) by mouth 3 (three) times daily as needed for pain. 01/23/22  Yes Paulette Blanch, MD  BINAXNOW COVID-19 AG HOME TEST KIT See admin instructions. 09/04/21   [provider]  ibuprofen (ADVIL) 600 MG tablet Take 1 tablet (600 mg total) by mouth every 6 (six) hours as needed. 12/03/19   Schuman, Stefanie Libel, MD  Multiple Vitamin (MULTIVITAMIN WITH MINERALS) TABS tablet Take 1 tablet by mouth daily.    [provider]  sertraline (ZOLOFT) 50 MG tablet  Take 50 mg by mouth daily. 08/19/19   [provider]     Allergies  Adhesive [tape] and Steri-strip compound benzoin [benzoin compound]   Family History   Family History  Problem Relation Age of Onset   Breast cancer Maternal Aunt 36   Breast cancer Maternal Grandmother 47   Breast cancer Paternal Grandmother 90   Breast cancer Cousin 52       mat cousin     Physical Exam  Triage Vital Signs: ED Triage Vitals [01/23/22 0141]  Enc Vitals Group     BP 134/74     Pulse Rate 76     Resp 16     Temp 98.4 F (36.9 C)     Temp Source Oral     SpO2 99 %     Weight 150 lb (68 kg)     Height '5\' 7"'  (1.702 m)     Head Circumference      Peak Flow      Pain Score 3     Pain Loc      Pain Edu?      Excl. in [place]?     Updated Vital Signs: BP 134/74   Pulse 76   Temp 98.4 F (36.9 C) (Oral)   Resp 16   Ht '5\' 7"'  (1.702 m)   Wt 68 kg   LMP 12/01/2014  SpO2 99%   BMI 23.49 kg/m    General: Awake, no distress.  CV:  Good peripheral perfusion.  Resp:  Normal effort.  Abd:  Nontender to light or deep palpation.  No CVAT.  No distention.  Other:  No truncal vesicles   ED Results / Procedures / Treatments  Labs (all labs ordered are listed, but only abnormal results are displayed) Labs Reviewed  CBC - Abnormal; Notable for the following components:      Result Value   WBC 14.6 (*)    All other components within normal limits  COMPREHENSIVE METABOLIC PANEL - Abnormal; Notable for the following components:   Glucose, Bld 116 (*)    Creatinine, Ser 1.08 (*)    All other components within normal limits  URINALYSIS, ROUTINE W REFLEX MICROSCOPIC - Abnormal; Notable for the following components:   Color, Urine AMBER (*)    APPearance CLOUDY (*)    Hgb urine dipstick LARGE (*)    Protein, ur 100 (*)    Leukocytes,Ua LARGE (*)    RBC / HPF >50 (*)    WBC, UA >50 (*)    All other components within normal limits  URINE CULTURE  LIPASE, BLOOD      EKG  None   RADIOLOGY None   Official radiology report(s): No results found.   PROCEDURES:  Critical Care performed: No  Procedures   MEDICATIONS ORDERED IN ED: Medications  cephALEXin (KEFLEX) capsule 500 mg (has no administration in time range)  phenazopyridine (PYRIDIUM) tablet 200 mg (has no administration in time range)     IMPRESSION / MDM / ASSESSMENT AND PLAN / ED COURSE  I reviewed the triage vital signs and the nursing notes.                             51 year old female presenting with suprapubic abdominal pain, urinary frequency and dysuria. Differential diagnosis includes, but is not limited to, ovarian cyst, ovarian torsion, acute appendicitis, diverticulitis, urinary tract infection/pyelonephritis, endometriosis, bowel obstruction, colitis, renal colic, gastroenteritis, hernia, etc. I have personally reviewed patient's records and see that she had a podiatry office visit on 11/22/2021 for neuroma.  Patient's presentation is most consistent with acute, uncomplicated illness.  Laboratory results demonstrate moderate leukocytosis WBC 14.6, mildly elevated creatinine 1.08, UA demonstrating large leukocytes and greater than 50 WBC.  Patient is afebrile without clinical signs for sepsis or obstructing kidney stones.  Will treat with Keflex, Pyridium for dysuria and patient will follow-up with her PCP.  Strict return precautions given.  Patient and family member verbalized understanding agree with plan of care.      FINAL CLINICAL IMPRESSION(S) / ED DIAGNOSES   Final diagnoses:  Lower abdominal pain  Lower urinary tract infectious disease  Dysuria     Rx / DC Orders   ED Discharge Orders          Ordered    cephALEXin (KEFLEX) 500 MG capsule  3 times daily        01/23/22 0459    phenazopyridine (PYRIDIUM) 200 MG tablet  3 times daily PRN        01/23/22 0459             Note:  This document was prepared using Dragon voice recognition  software and may include unintentional dictation errors.   Paulette Blanch, MD 01/23/22 [id number]

## 2022-01-23 NOTE — ED Triage Notes (Signed)
Pt presents to ER with c/o lower abd pain that started yesterday morning, but states woke her from her sleep tonight.  Pt also states she has had some bleeding when she wipes.  Pt unsure if blood is vaginal, or from urinary tract.  Pt endorses some pain with urination and increased urinary frequency and some RLQ pain.  Pt denies n/v/d.  Pt [place] A&O x4 at this time in NAD in triage.

## 2022-01-25 LAB — URINE CULTURE: Culture: 50000 — AB

## 2022-02-01 NOTE — Progress Notes (Unsigned)
New patient visit   Patient: Victoria Baldwin   DOB: 05-30-1971   51 y.o. Female  MRN: [id number] Visit Date: 02/07/2022  Today's healthcare provider: Gwyneth Sprout, FNP   No chief complaint on file.  Subjective    Victoria Baldwin is a 51 y.o. female who presents today as a new patient to establish care.  HPI  ***  Past Medical History:  Diagnosis Date   Anxiety    Past Surgical History:  Procedure Laterality Date   BUNIONECTOMY Left    HYSTEROSCOPY WITH D & C N/A 12/03/2019   Procedure: DILATATION AND CURETTAGE /HYSTEROSCOPY;  Surgeon: Homero Fellers, MD;  Location: [facility] ORS;  Service: Gynecology;  Laterality: N/A;   Family Status  Relation Name Status   Mat Aunt  Alive   MGM  Alive   [place]  Deceased   Cousin  Deceased   Family History  Problem Relation Age of Onset   Breast cancer Maternal Aunt 22   Breast cancer Maternal Grandmother 68   Breast cancer Paternal Grandmother 10   Breast cancer Cousin 53       mat cousin   Social History   Socioeconomic History   Marital status: Married    Spouse name: Not on file   Number of children: Not on file   Years of education: Not on file   Highest education level: Not on file  Occupational History   Not on file  Tobacco Use   Smoking status: Never   Smokeless tobacco: Never  Vaping Use   Vaping Use: Never used  Substance and Sexual Activity   Alcohol use: Yes    Alcohol/week: 4.0 standard drinks of alcohol    Types: 4 Glasses of wine per week   Drug use: No   Sexual activity: Not on file  Other Topics Concern   Not on file  Social History Narrative   Not on file   Social Determinants of Health   Financial Resource Strain: Not on file  Food Insecurity: Not on file  Transportation Needs: Not on file  Physical Activity: Not on file  Stress: Not on file  Social Connections: Not on file   Outpatient Medications Prior to Visit  Medication Sig   BINAXNOW COVID-19 AG HOME TEST KIT See  admin instructions.   cephALEXin (KEFLEX) 500 MG capsule Take 1 capsule (500 mg total) by mouth 3 (three) times daily.   ibuprofen (ADVIL) 600 MG tablet Take 1 tablet (600 mg total) by mouth every 6 (six) hours as needed.   Multiple Vitamin (MULTIVITAMIN WITH MINERALS) TABS tablet Take 1 tablet by mouth daily.   phenazopyridine (PYRIDIUM) 200 MG tablet Take 1 tablet (200 mg total) by mouth 3 (three) times daily as needed for pain.   sertraline (ZOLOFT) 50 MG tablet Take 50 mg by mouth daily.   No facility-administered medications prior to visit.   Allergies  Allergen Reactions   Adhesive [Tape] Rash   Steri-Strip Compound Benzoin [Benzoin Compound] Rash    Immunization History  Administered Date(s) Administered   PFIZER Comirnaty(Gray Top)Covid-19 Tri-Sucrose Vaccine 10/08/2019, 10/29/2019    Health Maintenance  Topic Date Due   HIV Screening  Never done   Hepatitis C Screening  Never done   COLONOSCOPY (Pts 45-10yr Insurance coverage will need to be confirmed)  Never done   Zoster Vaccines- Shingrix (1 of 2) Never done   COVID-19 Vaccine (4 - Pfizer series) 08/14/2020   INFLUENZA VACCINE  01/16/2022  PAP SMEAR-Modifier  08/13/2022   MAMMOGRAM  12/13/2022   TETANUS/TDAP  07/08/2024   HPV VACCINES  Aged Out    Patient Care Team: Gwyneth Sprout, FNP as PCP - General (Family Medicine)  Review of Systems  {Labs  Heme  Chem  Endocrine  Serology  Results Review (optional):23779}   Objective    LMP 12/01/2014  {Show previous vital signs (optional):23777}  Physical Exam ***  Depression Screen     No data to display         No results found for any visits on 02/07/22.  Assessment & Plan     ***  No follow-ups on file.     {provider attestation***:1}   Gwyneth Sprout, [place] [phone] (phone) [phone] (fax)  [place]

## 2022-02-07 ENCOUNTER — Encounter: Payer: Self-pay | Admitting: Family Medicine

## 2022-02-07 ENCOUNTER — Ambulatory Visit: Payer: BC Managed Care – PPO | Admitting: Family Medicine

## 2022-02-07 VITALS — BP 121/76 | HR 66 | Temp 98.0°F | Resp 16 | Ht 67.0 in | Wt 153.8 lb

## 2022-02-07 DIAGNOSIS — F419 Anxiety disorder, unspecified: Secondary | ICD-10-CM | POA: Diagnosis not present

## 2022-02-07 DIAGNOSIS — Z1231 Encounter for screening mammogram for malignant neoplasm of breast: Secondary | ICD-10-CM | POA: Diagnosis not present

## 2022-02-07 DIAGNOSIS — Z5320 Procedure and treatment not carried out because of patient's decision for unspecified reasons: Secondary | ICD-10-CM

## 2022-02-07 DIAGNOSIS — Z23 Encounter for immunization: Secondary | ICD-10-CM | POA: Diagnosis not present

## 2022-02-07 DIAGNOSIS — Z0000 Encounter for general adult medical examination without abnormal findings: Secondary | ICD-10-CM

## 2022-02-07 DIAGNOSIS — Z1211 Encounter for screening for malignant neoplasm of colon: Secondary | ICD-10-CM | POA: Diagnosis not present

## 2022-02-07 MED ORDER — SERTRALINE HCL 50 MG PO TABS: 50.0000 mg | ORAL_TABLET | Freq: Every day | ORAL | 3 refills | Status: DC

## 2022-02-07 NOTE — Patient Instructions (Signed)
Please call and schedule your mammogram:  [facility]  [address], [address] [facility] [place],  [place]  [address] Get Driving Directions Main: [phone]  Sunday:Closed Monday:7:20 AM - 5:00 PM Tuesday:7:20 AM - 5:00 PM Wednesday:7:20 AM - 5:00 PM Thursday:7:20 AM - 5:00 PM Friday:7:20 AM - 4:30 PM Saturday:Closed  

## 2022-02-07 NOTE — Assessment & Plan Note (Signed)
Low risk screen DECLINED; encouraged to "know your status" Recommend repeat screen if risk factors change

## 2022-02-07 NOTE — Assessment & Plan Note (Signed)
Consent received; VIS provided; no immediate side effects. Repeat in 6-8 weeks for full vaccination.

## 2022-02-07 NOTE — Assessment & Plan Note (Signed)
Consent received; VIS provided; no immediate side effects. Repeat annually, if desired.

## 2022-02-07 NOTE — Assessment & Plan Note (Signed)
Low risk screen DECLINED Treatable, and curable. If left untreated Hep C can lead to cirrhosis and liver failure. Encourage routine testing; recommend repeat testing if risk factors change.

## 2022-02-07 NOTE — Assessment & Plan Note (Signed)
UTD on vision and dental Due for Colon Cancer screen and Mammo PAP due in 2026 Things to do to keep yourself healthy  - Exercise at least 30-45 minutes a day, 3-4 days a week.  - Eat a low-fat diet with lots of fruits and vegetables, up to 7-9 servings per day.  - Seatbelts can save your life. Wear them always.  - Smoke detectors on every level of your home, check batteries every year.  - Eye Doctor - have an eye exam every 1-2 years  - Safe sex - if you may be exposed to STDs, use a condom.  - Alcohol -  If you drink, do it moderately, less than 2 drinks per day.  - [place]. Choose someone to speak for you if you are not able.  - Depression is common in our stressful world.If you're feeling down or losing interest in things you normally enjoy, please come in for a visit.  - Violence - If anyone is threatening or hurting you, please call immediately.

## 2022-02-07 NOTE — Assessment & Plan Note (Signed)
Hx of tubular adenoma; due for q 5 year colon cancer screening Denies current complaints Referral placed to local GI per pt request to change providers

## 2022-02-07 NOTE — Assessment & Plan Note (Signed)
Due for screening for mammogram, denies breast concerns, provided with phone number to call and schedule appointment for mammogram. Encouraged to repeat breast cancer screening every 1-2 years. Family hx +

## 2022-02-07 NOTE — Assessment & Plan Note (Signed)
Chronic, stable Wishes to continue Zoloft at 50 mg QD Contracted to safety Denies additional titration or referral to psychiatry

## 2022-02-08 ENCOUNTER — Telehealth: Payer: Self-pay

## 2022-02-08 ENCOUNTER — Other Ambulatory Visit: Payer: Self-pay

## 2022-02-08 DIAGNOSIS — Z86010 Personal history of colonic polyps: Secondary | ICD-10-CM

## 2022-02-08 LAB — CBC WITH DIFFERENTIAL/PLATELET
Basophils Absolute: 0 10*3/uL (ref 0.0–0.2)
Basos: 0 %
EOS (ABSOLUTE): 0.1 10*3/uL (ref 0.0–0.4)
Eos: 1 %
Hematocrit: 41.4 % (ref 34.0–46.6)
Hemoglobin: 13.6 g/dL (ref 11.1–15.9)
Immature Grans (Abs): 0 10*3/uL (ref 0.0–0.1)
Immature Granulocytes: 0 %
Lymphocytes Absolute: 3.2 10*3/uL — ABNORMAL HIGH (ref 0.7–3.1)
Lymphs: 37 %
MCH: 30.4 pg (ref 26.6–33.0)
MCHC: 32.9 g/dL (ref 31.5–35.7)
MCV: 92 fL (ref 79–97)
Monocytes Absolute: 0.7 10*3/uL (ref 0.1–0.9)
Monocytes: 8 %
Neutrophils Absolute: 4.6 10*3/uL (ref 1.4–7.0)
Neutrophils: 54 %
Platelets: 278 10*3/uL (ref 150–450)
RBC: 4.48 x10E6/uL (ref 3.77–5.28)
RDW: 12.6 % (ref 11.7–15.4)
WBC: 8.6 10*3/uL (ref 3.4–10.8)

## 2022-02-08 LAB — LIPID PANEL
Chol/HDL Ratio: 4.8 ratio — ABNORMAL HIGH (ref 0.0–4.4)
Cholesterol, Total: 276 mg/dL — ABNORMAL HIGH (ref 100–199)
HDL: 57 mg/dL (ref >39)
LDL Chol Calc (NIH): 188 mg/dL — ABNORMAL HIGH (ref 0–99)
Triglycerides: 170 mg/dL — ABNORMAL HIGH (ref 0–149)
VLDL Cholesterol Cal: 31 mg/dL (ref 5–40)

## 2022-02-08 LAB — COMPREHENSIVE METABOLIC PANEL
ALT: 7 IU/L (ref 0–32)
AST: 18 IU/L (ref 0–40)
Albumin/Globulin Ratio: 1.9 (ref 1.2–2.2)
Albumin: 4.5 g/dL (ref 3.8–4.9)
Alkaline Phosphatase: 87 IU/L (ref 44–121)
BUN/Creatinine Ratio: 17 (ref 9–23)
BUN: 17 mg/dL (ref 6–24)
Bilirubin Total: 0.5 mg/dL (ref 0.0–1.2)
CO2: 23 mmol/L (ref 20–29)
Calcium: 9.4 mg/dL (ref 8.7–10.2)
Chloride: 102 mmol/L (ref 96–106)
Creatinine, Ser: 0.99 mg/dL (ref 0.57–1.00)
Globulin, Total: 2.4 g/dL (ref 1.5–4.5)
Glucose: 84 mg/dL (ref 70–99)
Potassium: 4.4 mmol/L (ref 3.5–5.2)
Sodium: 140 mmol/L (ref 134–144)
Total Protein: 6.9 g/dL (ref 6.0–8.5)
eGFR: 68 mL/min/{1.73_m2} (ref >59)

## 2022-02-08 LAB — TSH+FREE T4
Free T4: 0.98 ng/dL (ref 0.82–1.77)
TSH: 2.17 u[IU]/mL (ref 0.450–4.500)

## 2022-02-08 MED ORDER — NA SULFATE-K SULFATE-MG SULF 17.5-3.13-1.6 GM/177ML PO SOLN: 1.0000 | Freq: Once | ORAL | 0 refills | Status: AC

## 2022-02-08 NOTE — Progress Notes (Signed)
Improved cell count; reassuring given recent UTI.  Cholesterol is elevated- goal for total is <200 and goal for bad/LDL is <100. Fats also elevated; however, no need to repeat given non fasting sample. I recommend diet low in saturated fat and regular exercise - 30 min at least 5 times per week  The 10-year ASCVD risk score (Arnett DK, et al., 2019) is: 2.1%   Values used to calculate the score:     Age: 51 years     Sex: Female     Is Non-Hispanic African American: No     Diabetic: No     Tobacco smoker: No     Systolic Blood Pressure: 948 mmHg     Is BP treated: No     HDL Cholesterol: 57 mg/dL     Total Cholesterol: 276 mg/dL Heart attack and stroke risk is 2.1% estimated within the next 10 years which is low.   All other labs normal and stable.  Please call for mammogram appt; also, let us know if you don't receive a call within 2 weeks for GI for colon cancer screening.  Victoria Baldwin, [address] [place], [place] [address] [phone] (phone) [phone] (fax) [place]

## 2022-02-08 NOTE — Telephone Encounter (Signed)
Gastroenterology Pre-Procedure Review  Request Date: 03/23/22 Requesting Physician: Dr. Marius Ditch  PATIENT REVIEW QUESTIONS: The patient responded to the following health history questions as indicated:    1. Are you having any GI issues? no 2. Do you have a personal history of Polyps? yes (2018 colonoscopy performed at [facility]) 3. Do you have a family history of Colon Cancer or Polyps? yes paternal grandmother polyps and colon cancer 4. Diabetes Mellitus? no 5. Joint replacements in the past 12 months?no 6. Major health problems in the past 3 months?01/18/22 lower abdominal pain ER Visit 7. Any artificial heart valves, MVP, or defibrillator?no    MEDICATIONS & ALLERGIES:    Patient reports the following regarding taking any anticoagulation/antiplatelet therapy:   Plavix, Coumadin, Eliquis, Xarelto, Lovenox, Pradaxa, Brilinta, or Effient? no Aspirin? no  Patient confirms/reports the following medications:  Current Outpatient Medications  Medication Sig Dispense Refill   ibuprofen (ADVIL) 600 MG tablet Take 1 tablet (600 mg total) by mouth every 6 (six) hours as needed. 60 tablet 0   Multiple Vitamin (MULTIVITAMIN WITH MINERALS) TABS tablet Take 1 tablet by mouth daily. (Patient not taking: Reported on 02/07/2022)     sertraline (ZOLOFT) 50 MG tablet Take 1 tablet (50 mg total) by mouth daily. 90 tablet 3   No current facility-administered medications for this visit.    Patient confirms/reports the following allergies:  Allergies  Allergen Reactions   Adhesive [Tape] Rash   Steri-Strip Compound Benzoin [Benzoin Compound] Rash    No orders of the defined types were placed in this encounter.   AUTHORIZATION INFORMATION Primary Insurance: 1D#: Group #:  Secondary Insurance: 1D#: Group #:  SCHEDULE INFORMATION: Date: 03/23/22 Time: Location: [facility]

## 2022-03-23 ENCOUNTER — Ambulatory Visit: Payer: BC Managed Care – PPO | Admitting: Anesthesiology

## 2022-03-23 ENCOUNTER — Other Ambulatory Visit: Payer: Self-pay

## 2022-03-23 ENCOUNTER — Encounter: Payer: Self-pay | Admitting: Gastroenterology

## 2022-03-23 ENCOUNTER — Ambulatory Visit
Admission: RE | Admit: 2022-03-23 | Discharge: 2022-03-23 | Disposition: A | Discharge status: Home / Self Care | Payer: BC Managed Care – PPO | Source: Ambulatory Visit | Attending: Gastroenterology | Admitting: Gastroenterology

## 2022-03-23 ENCOUNTER — Encounter
Admission: RE | Disposition: A | Discharge status: Home / Self Care | Payer: Self-pay | Source: Ambulatory Visit | Attending: Gastroenterology

## 2022-03-23 DIAGNOSIS — K635 Polyp of colon: Secondary | ICD-10-CM | POA: Diagnosis not present

## 2022-03-23 DIAGNOSIS — Z1211 Encounter for screening for malignant neoplasm of colon: Secondary | ICD-10-CM | POA: Diagnosis not present

## 2022-03-23 DIAGNOSIS — Z860100 Personal history of colon polyps, unspecified: Secondary | ICD-10-CM

## 2022-03-23 DIAGNOSIS — D123 Benign neoplasm of transverse colon: Secondary | ICD-10-CM | POA: Diagnosis not present

## 2022-03-23 DIAGNOSIS — F419 Anxiety disorder, unspecified: Secondary | ICD-10-CM | POA: Diagnosis not present

## 2022-03-23 DIAGNOSIS — D122 Benign neoplasm of ascending colon: Secondary | ICD-10-CM | POA: Diagnosis not present

## 2022-03-23 HISTORY — PX: COLONOSCOPY WITH PROPOFOL: SHX5780

## 2022-03-23 LAB — POCT PREGNANCY, URINE: Preg Test, Ur: NEGATIVE

## 2022-03-23 SURGERY — COLONOSCOPY WITH PROPOFOL
Anesthesia: General

## 2022-03-23 MED ORDER — PROPOFOL 500 MG/50ML IV EMUL
INTRAVENOUS | Status: DC | PRN
  Administered 2022-03-23: 150 ug/kg/min via INTRAVENOUS

## 2022-03-23 MED ORDER — PROPOFOL 1000 MG/100ML IV EMUL
INTRAVENOUS | Status: AC
  Filled 2022-03-23: qty 100

## 2022-03-23 MED ORDER — PROPOFOL 10 MG/ML IV BOLUS
INTRAVENOUS | Status: DC | PRN
  Administered 2022-03-23: 100 mg via INTRAVENOUS

## 2022-03-23 MED ORDER — LIDOCAINE HCL (CARDIAC) PF 100 MG/5ML IV SOSY
PREFILLED_SYRINGE | INTRAVENOUS | Status: DC | PRN
  Administered 2022-03-23: 50 mg via INTRAVENOUS

## 2022-03-23 MED ORDER — SODIUM CHLORIDE 0.9 % IV SOLN: INTRAVENOUS | Status: DC

## 2022-03-23 NOTE — Transfer of Care (Signed)
Immediate Anesthesia Transfer of Care Note  Patient: Victoria Baldwin  Procedure(s) Performed: COLONOSCOPY WITH PROPOFOL  Patient Location: PACU  Anesthesia Type:MAC  Level of Consciousness: awake, alert  and oriented  Airway & Oxygen Therapy: Patient Spontanous Breathing  Post-op Assessment: Report given to RN and Post -op Vital signs reviewed and stable  Post vital signs: stable  Last Vitals:  Vitals Value Taken Time  BP 107/61 03/23/22 1010  Temp    Pulse 76 03/23/22 1011  Resp 24 03/23/22 1011  SpO2 100 % 03/23/22 1011  Vitals shown include unvalidated device data.  Last Pain:  Vitals:   03/23/22 0843  TempSrc: Temporal  PainSc: 0-No pain         Complications: No notable events documented.

## 2022-03-23 NOTE — Op Note (Signed)
[facility] Patient Name: Victoria Baldwin Procedure Date: 03/23/2022 9:27 AM MRN: [id number] Account #: [id number] Date of Birth: January 13, 1971 Admit Type: Outpatient Age: 51 Room: [facility] ENDO ROOM 2 Gender: Female Note Status: Finalized Instrument Name: Park Meo [id number] Procedure:             Colonoscopy Indications:           Surveillance: Personal history of adenomatous polyps                         on last colonoscopy 5 years ago Providers:             Lin Landsman MD, MD Referring MD:          Jaci Standard. Rollene Rotunda (Referring MD) Medicines:             General Anesthesia Complications:         No immediate complications. Estimated blood loss: None. Procedure:             Pre-Anesthesia Assessment:                        - Prior to the procedure, a History and Physical was                         performed, and patient medications and allergies were                         reviewed. The patient is competent. The risks and                         benefits of the procedure and the sedation options and                         risks were discussed with the patient. All questions                         were answered and informed consent was obtained.                         Patient identification and proposed procedure were                         verified by the physician, the nurse, the                         anesthesiologist, the anesthetist and the technician                         in the pre-procedure area in the procedure room in the                         endoscopy suite. Mental Status Examination: alert and                         oriented. Airway Examination: normal oropharyngeal                         airway and neck mobility. Respiratory Examination:  clear to auscultation. CV Examination: normal.                         Prophylactic Antibiotics: The patient does not require                         prophylactic  antibiotics. Prior Anticoagulants: The                         patient has taken no previous anticoagulant or                         antiplatelet agents. ASA Grade Assessment: II - A                         patient with mild systemic disease. After reviewing                         the risks and benefits, the patient was deemed in                         satisfactory condition to undergo the procedure. The                         anesthesia plan was to use general anesthesia.                         Immediately prior to administration of medications,                         the patient was re-assessed for adequacy to receive                         sedatives. The heart rate, respiratory rate, oxygen                         saturations, blood pressure, adequacy of pulmonary                         ventilation, and response to care were monitored                         throughout the procedure. The physical status of the                         patient was re-assessed after the procedure.                        After obtaining informed consent, the colonoscope was                         passed under direct vision. Throughout the procedure,                         the patient's blood pressure, pulse, and oxygen                         saturations were monitored continuously. The  Colonoscope was introduced through the anus and                         advanced to the the terminal ileum, with                         identification of the appendiceal orifice and IC                         valve. The colonoscopy was performed without                         difficulty. The patient tolerated the procedure well.                         The quality of the bowel preparation was evaluated                         using the BBPS [facility] Bowel Preparation Scale) with                         scores of: Right Colon = 3, Transverse Colon = 3 and                         Left Colon =  3 (entire mucosa seen well with no                         residual staining, small fragments of stool or opaque                         liquid). The total BBPS score equals 9. Findings:      The perianal and digital rectal examinations were normal. Pertinent       negatives include normal sphincter tone and no palpable rectal lesions.      The terminal ileum appeared normal.      Two sessile polyps were found in the transverse colon and ascending       colon. The polyps were 3 to 5 mm in size. These polyps were removed with       a cold snare. Resection and retrieval were complete.      The retroflexed view of the distal rectum and anal verge was normal and       showed no anal or rectal abnormalities. Impression:            - The examined portion of the ileum was normal.                        - Two 3 to 5 mm polyps in the transverse colon and in                         the ascending colon, removed with a cold snare.                         Resected and retrieved.                        - The distal rectum and anal verge are normal on  retroflexion view. Recommendation:        - Discharge patient to home (with escort).                        - Resume regular diet today.                        - Continue present medications.                        - Await pathology results.                        - Repeat colonoscopy in 5 years for surveillance based                         on pathology results. Procedure Code(s):     --- Professional ---                        [id number], Colonoscopy, flexible; with removal of                         tumor(s), polyp(s), or other lesion(s) by snare                         technique Diagnosis Code(s):     --- Professional ---                        Z86.010, Personal history of colonic polyps                        K63.5, Polyp of colon CPT copyright 2019 American Medical Association. All rights reserved. The codes documented in this  report are preliminary and upon coder review may  be revised to meet current compliance requirements. Dr. Ulyess Mort Lin Landsman MD, MD 03/23/2022 10:07:55 AM This report has been signed electronically. Number of Addenda: 0 Note Initiated On: 03/23/2022 9:27 AM Scope Withdrawal Time: 0 hours 12 minutes 20 seconds  Total Procedure Duration: 0 hours 18 minutes 23 seconds  Estimated Blood Loss:  Estimated blood loss: none.      [facility]

## 2022-03-23 NOTE — H&P (Signed)
Cephas Darby, MD [address]  [place]  [place], [place] [address]  Main: [phone]  Fax: [phone] Pager: [phone]  Primary Care Physician:  Gwyneth Sprout, FNP Primary Gastroenterologist:  Dr. Cephas Darby  Pre-Procedure History & Physical: HPI:  Victoria Baldwin is a 51 y.o. female is here for an colonoscopy.   Past Medical History:  Diagnosis Date   Anxiety     Past Surgical History:  Procedure Laterality Date   BUNIONECTOMY Left    COLONOSCOPY     COLONOSCOPY     HYSTEROSCOPY WITH D & C N/A 12/03/2019   Procedure: DILATATION AND CURETTAGE /HYSTEROSCOPY;  Surgeon: Homero Fellers, MD;  Location: [facility] ORS;  Service: Gynecology;  Laterality: N/A;    Prior to Admission medications   Medication Sig Start Date End Date Taking? Authorizing Provider  ibuprofen (ADVIL) 600 MG tablet Take 1 tablet (600 mg total) by mouth every 6 (six) hours as needed. 12/03/19   Schuman, Stefanie Libel, MD  Multiple Vitamin (MULTIVITAMIN WITH MINERALS) TABS tablet Take 1 tablet by mouth daily. Patient not taking: Reported on 02/07/2022    [provider]  sertraline (ZOLOFT) 50 MG tablet Take 1 tablet (50 mg total) by mouth daily. 02/07/22   Gwyneth Sprout, FNP    Allergies as of 02/08/2022 - Review Complete 02/07/2022  Allergen Reaction Noted   Adhesive [tape] Rash 06/02/2018   Steri-strip compound benzoin [benzoin compound] Rash 06/02/2018    Family History  Problem Relation Age of Onset   Breast cancer Maternal Aunt 58   Breast cancer Maternal Grandmother 80   Breast cancer Paternal Grandmother 18   Breast cancer Cousin 53       mat cousin    Social History   Socioeconomic History   Marital status: Married    Spouse name: Not on file   Number of children: Not on file   Years of education: Not on file   Highest education level: Not on file  Occupational History   Not on file  Tobacco Use   Smoking status: Never   Smokeless tobacco: Never   Vaping Use   Vaping Use: Never used  Substance and Sexual Activity   Alcohol use: Yes    Alcohol/week: 4.0 standard drinks of alcohol    Types: 4 Glasses of wine per week    Comment: none last 24 hrs   Drug use: No   Sexual activity: Not on file  Other Topics Concern   Not on file  Social History Narrative   Not on file   Social Determinants of Health   Financial Resource Strain: Not on file  Food Insecurity: Not on file  Transportation Needs: Not on file  Physical Activity: Not on file  Stress: Not on file  Social Connections: Not on file  Intimate Partner Violence: Not on file    Review of Systems: See HPI, otherwise negative ROS  Physical Exam: LMP 12/01/2014  General:   Alert,  pleasant and cooperative in NAD Head:  Normocephalic and atraumatic. Neck:  Supple; no masses or thyromegaly. Lungs:  Clear throughout to auscultation.    Heart:  Regular rate and rhythm. Abdomen:  Soft, nontender and nondistended. Normal bowel sounds, without guarding, and without rebound.   Neurologic:  Alert and  oriented x4;  grossly normal neurologically.  Impression/Plan: Victoria Baldwin is here for an colonoscopy to be performed for h/o colon polyp  Risks, benefits, limitations, and alternatives regarding  colonoscopy have been  reviewed with the patient.  Questions have been answered.  All parties agreeable.   Sherri Sear, MD  03/23/2022, 8:36 AM

## 2022-03-23 NOTE — Anesthesia Postprocedure Evaluation (Signed)
Anesthesia Post Note  Patient: Victoria Baldwin  Procedure(s) Performed: COLONOSCOPY WITH PROPOFOL  Patient location during evaluation: PACU Anesthesia Type: General Level of consciousness: awake and alert Pain management: pain level controlled Vital Signs Assessment: post-procedure vital signs reviewed and stable Respiratory status: spontaneous breathing, nonlabored ventilation and respiratory function stable Cardiovascular status: blood pressure returned to baseline and stable Postop Assessment: no apparent nausea or vomiting Anesthetic complications: no   No notable events documented.   Last Vitals:  Vitals:   03/23/22 1010 03/23/22 1016  BP: 107/61 120/68  Pulse: 70 66  Resp: 18 18  Temp: (!) 36.3 C   SpO2: 100% 100%    Last Pain:  Vitals:   03/23/22 1016  TempSrc:   PainSc: 0-No pain                 [place] Ouch

## 2022-03-23 NOTE — Anesthesia Preprocedure Evaluation (Addendum)
Anesthesia Evaluation  Patient identified by MRN, date of birth, ID band Patient awake    Reviewed: Allergy & Precautions, NPO status , Patient's Chart, lab work & pertinent test results  Airway Mallampati: I  TM Distance: >3 FB Neck ROM: full    Dental no notable dental hx.    Pulmonary neg pulmonary ROS,    Pulmonary exam normal        Cardiovascular negative cardio ROS Normal cardiovascular exam     Neuro/Psych PSYCHIATRIC DISORDERS Anxiety negative neurological ROS     GI/Hepatic negative GI ROS, Neg liver ROS,   Endo/Other  negative endocrine ROS  Renal/GU negative Renal ROS  negative genitourinary   Musculoskeletal   Abdominal Normal abdominal exam  (+)   Peds  Hematology negative hematology ROS (+)   Anesthesia Other Findings Past Medical History: No date: Anxiety  Past Surgical History: No date: BUNIONECTOMY; Left 12/03/2019: HYSTEROSCOPY WITH D & C; N/A     Comment:  Procedure: DILATATION AND CURETTAGE /HYSTEROSCOPY;                Surgeon: Homero Fellers, MD;  Location: [facility] ORS;               Service: Gynecology;  Laterality: N/A;     Reproductive/Obstetrics negative OB ROS                            Anesthesia Physical Anesthesia Plan  ASA: 2  Anesthesia Plan: General   Post-op Pain Management:    Induction: Intravenous  PONV Risk Score and Plan: Propofol infusion and TIVA  Airway Management Planned: Natural Airway  Additional Equipment:   Intra-op Plan:   Post-operative Plan:   Informed Consent: I have reviewed the patients History and Physical, chart, labs and discussed the procedure including the risks, benefits and alternatives for the proposed anesthesia with the patient or authorized representative who has indicated his/her understanding and acceptance.     Dental Advisory Given  Plan Discussed with: Anesthesiologist, CRNA and  Surgeon  Anesthesia Plan Comments:        Anesthesia Quick Evaluation

## 2022-03-26 ENCOUNTER — Encounter: Payer: Self-pay | Admitting: Gastroenterology

## 2022-03-26 LAB — SURGICAL PATHOLOGY

## 2022-03-27 ENCOUNTER — Encounter: Payer: Self-pay | Admitting: Gastroenterology

## 2022-03-28 ENCOUNTER — Ambulatory Visit
Admission: RE | Admit: 2022-03-28 | Discharge: 2022-03-28 | Disposition: A | Discharge status: Home / Self Care | Payer: BC Managed Care – PPO | Source: Ambulatory Visit | Attending: Family Medicine | Admitting: Family Medicine

## 2022-03-28 DIAGNOSIS — Z1231 Encounter for screening mammogram for malignant neoplasm of breast: Secondary | ICD-10-CM | POA: Insufficient documentation

## 2022-03-29 NOTE — Progress Notes (Signed)
Hi [name]  Normal mammogram; repeat in 1 year.  Please let us know if you have any questions.  Thank you,  Tally Joe, FNP

## 2022-04-11 ENCOUNTER — Ambulatory Visit (INDEPENDENT_AMBULATORY_CARE_PROVIDER_SITE_OTHER): Payer: BC Managed Care – PPO | Admitting: Family Medicine

## 2022-04-11 DIAGNOSIS — Z23 Encounter for immunization: Secondary | ICD-10-CM

## 2022-04-11 NOTE — Progress Notes (Signed)
Patient is here for 2nd shingrix.

## 2022-04-26 ENCOUNTER — Other Ambulatory Visit: Payer: Self-pay | Admitting: Family Medicine

## 2022-11-27 IMAGING — MG MM DIGITAL SCREENING BILAT W/ TOMO AND CAD
8 series · 8 of 24 positions shown · non-contrast
Comparison: Previous exam(s).

CLINICAL DATA: Screening.

EXAM:
DIGITAL SCREENING BILATERAL MAMMOGRAM WITH TOMOSYNTHESIS AND CAD
TECHNIQUE: Bilateral screening digital craniocaudal and mediolateral oblique
mammograms were obtained. Bilateral screening digital breast
tomosynthesis was performed. The images were evaluated with
computer-aided detection.

[L CC synth-2D]
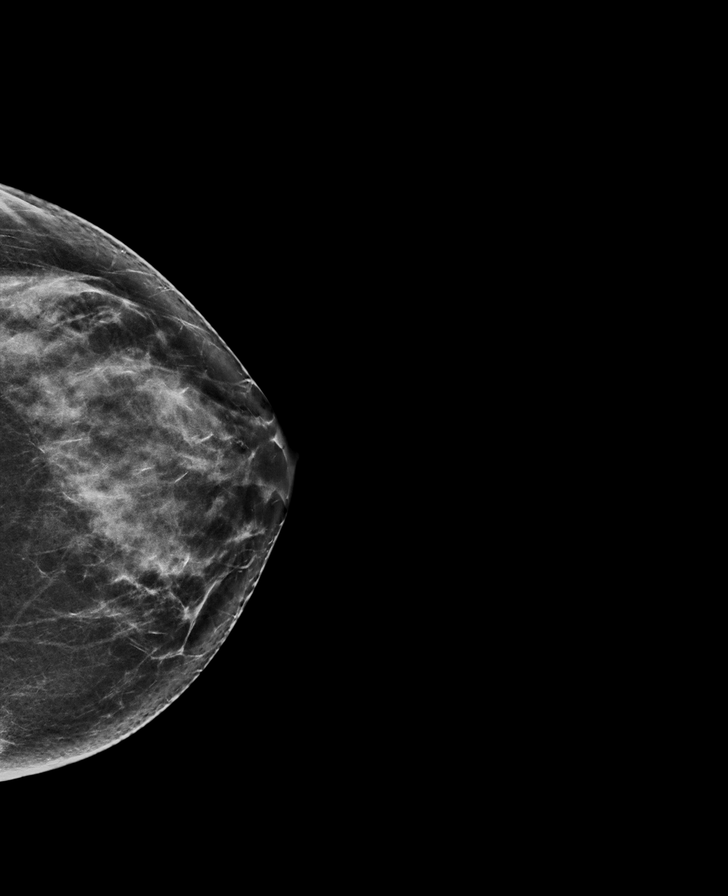

[R CC synth-2D]
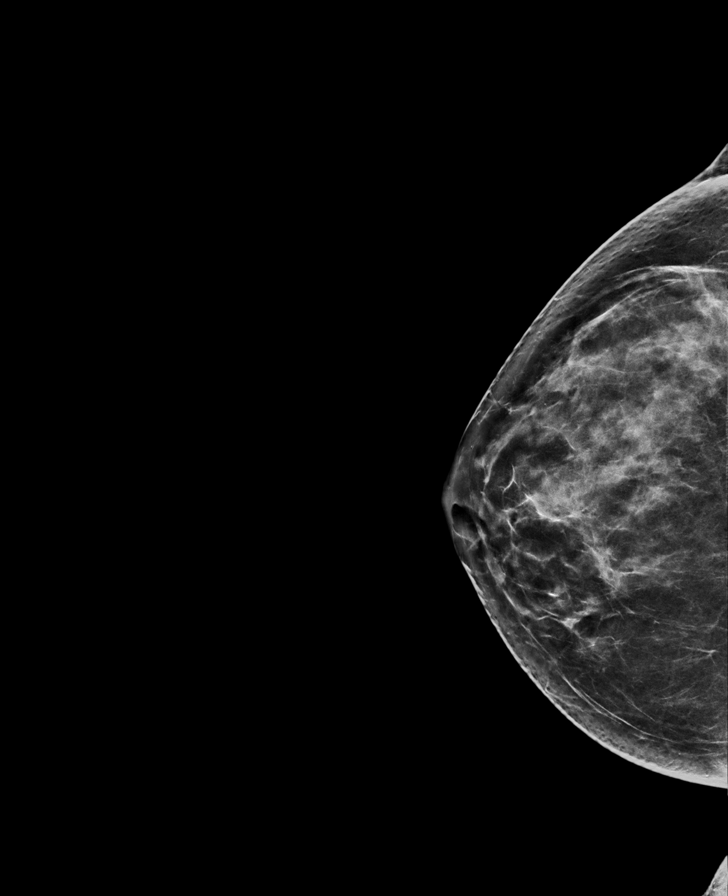

[L MLO synth-2D]
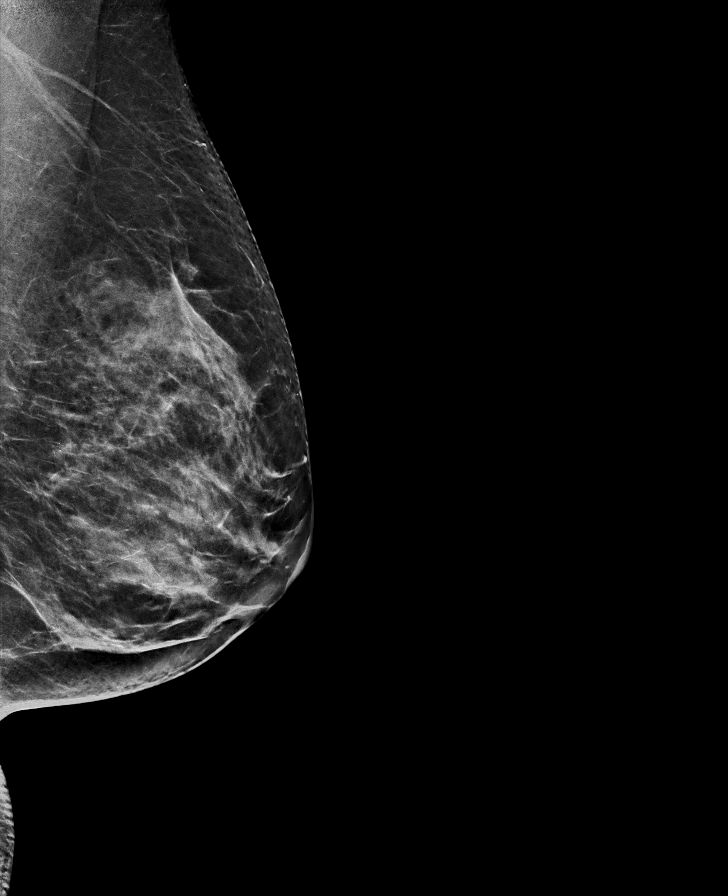

[R MLO synth-2D]
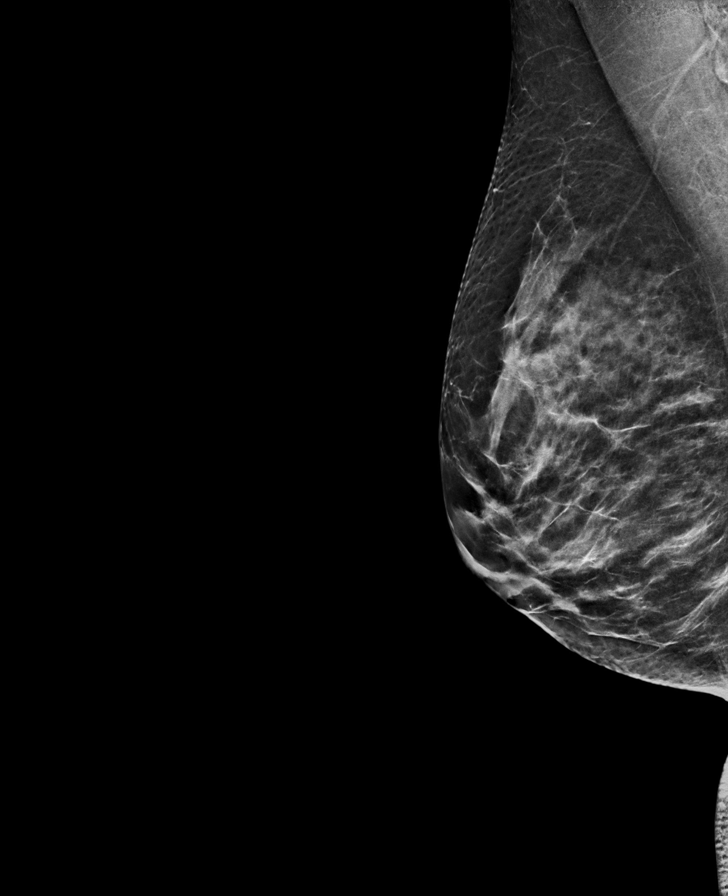

[L MLO tomo · tomo slice 35/69.0]
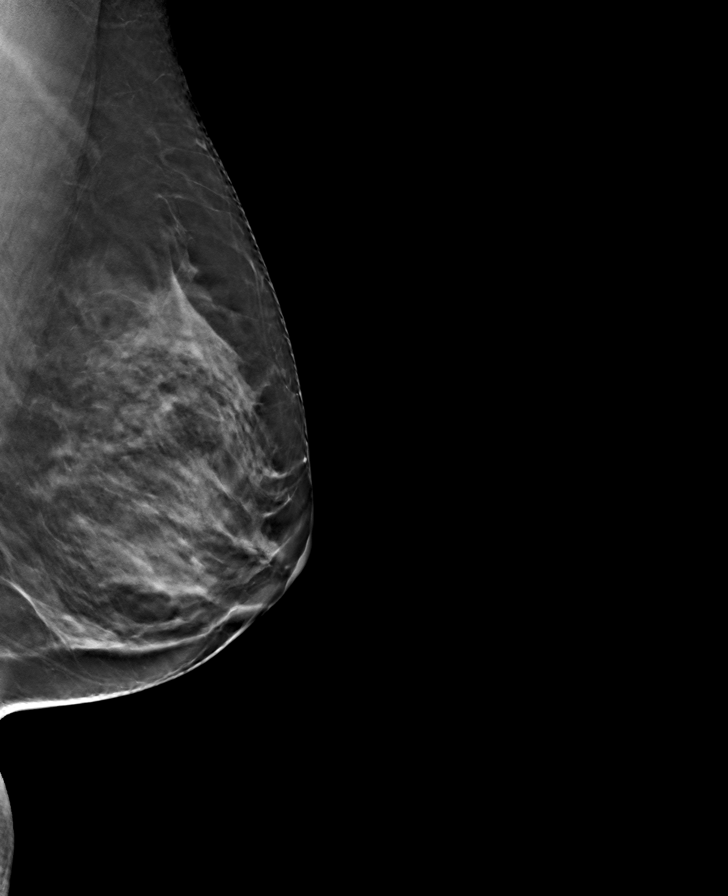

[L CC tomo · tomo slice 37/74.0]
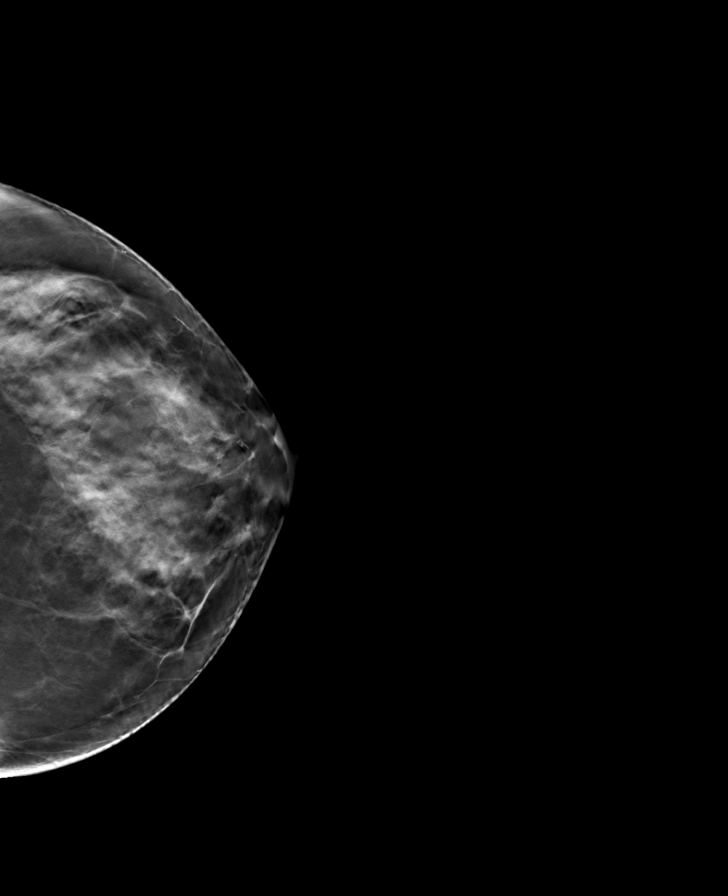

[R CC tomo · tomo slice 37/74.0]
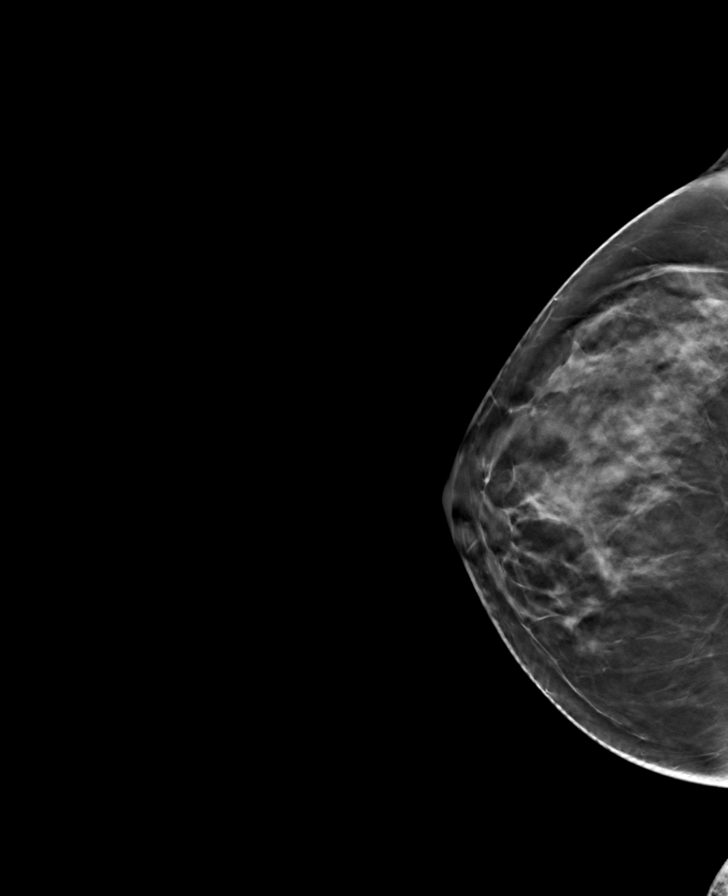

[R MLO tomo · tomo slice 35/68.0]
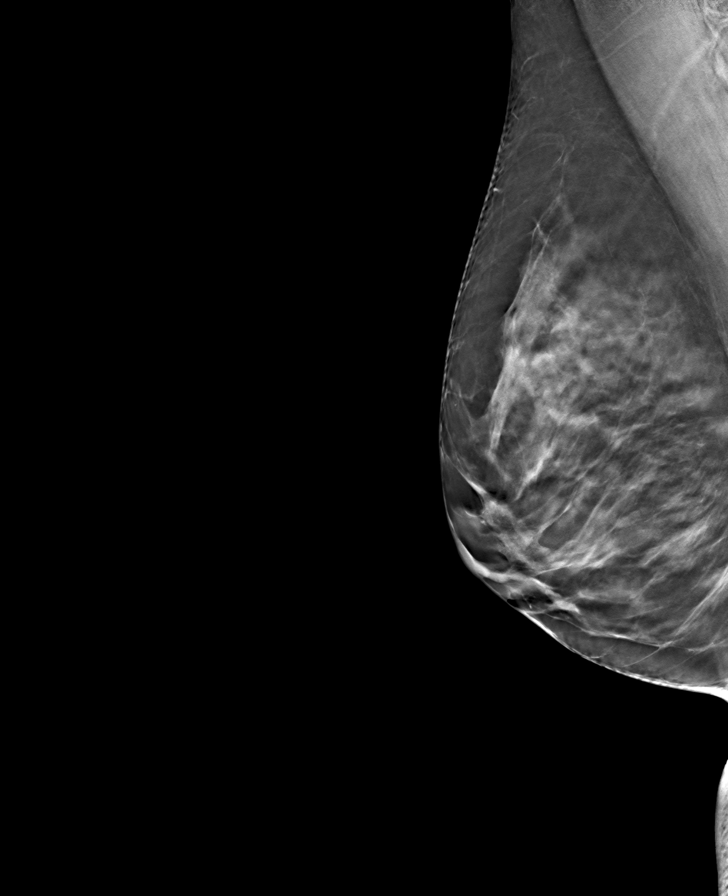

[8 of 24 positions shown; findings below may reference images not displayed]

ACR Breast Density Category c: The breast tissue is heterogeneously
dense, which may obscure small masses.
FINDINGS: There are no findings suspicious for malignancy.
IMPRESSION: No mammographic evidence of malignancy. A result letter of this
screening mammogram will be mailed directly to the patient.

RECOMMENDATION:
Screening mammogram in one year. (Code:Q3-W-BC3)

BI-RADS CATEGORY  1: Negative.

## 2023-02-26 ENCOUNTER — Ambulatory Visit (INDEPENDENT_AMBULATORY_CARE_PROVIDER_SITE_OTHER): Payer: BC Managed Care – PPO | Admitting: Family Medicine

## 2023-02-26 ENCOUNTER — Encounter: Payer: Self-pay | Admitting: Family Medicine

## 2023-02-26 VITALS — BP 113/74 | HR 70 | Ht 67.0 in | Wt 156.1 lb

## 2023-02-26 DIAGNOSIS — F39 Unspecified mood [affective] disorder: Secondary | ICD-10-CM | POA: Insufficient documentation

## 2023-02-26 DIAGNOSIS — Z0000 Encounter for general adult medical examination without abnormal findings: Secondary | ICD-10-CM

## 2023-02-26 DIAGNOSIS — G5702 Lesion of sciatic nerve, left lower limb: Secondary | ICD-10-CM | POA: Diagnosis not present

## 2023-02-26 DIAGNOSIS — Z23 Encounter for immunization: Secondary | ICD-10-CM | POA: Diagnosis not present

## 2023-02-26 DIAGNOSIS — Z0001 Encounter for general adult medical examination with abnormal findings: Secondary | ICD-10-CM | POA: Diagnosis not present

## 2023-02-26 DIAGNOSIS — Z1231 Encounter for screening mammogram for malignant neoplasm of breast: Secondary | ICD-10-CM

## 2023-02-26 NOTE — Patient Instructions (Signed)
Please call and schedule your mammogram:  [facility]  [address], [address] [facility] [place],  [place]  [address] Get Driving Directions Main: [phone]  Sunday:Closed Monday:7:20 AM - 5:00 PM Tuesday:7:20 AM - 5:00 PM Wednesday:7:20 AM - 5:00 PM Thursday:7:20 AM - 5:00 PM Friday:7:20 AM - 4:30 PM Saturday:Closed  

## 2023-02-26 NOTE — Assessment & Plan Note (Signed)
Complaints of L hip pain UTD on dental and vision UTD on PAP Due for screening for mammogram, denies breast concerns, provided with phone number to call and schedule appointment for mammogram. Encouraged to repeat breast cancer screening every 1-2 years. Things to do to keep yourself healthy  - Exercise at least 30-45 minutes a day, 3-4 days a week.  - Eat a low-fat diet with lots of fruits and vegetables, up to 7-9 servings per day.  - Seatbelts can save your life. Wear them always.  - Smoke detectors on every level of your home, check batteries every year.  - Eye Doctor - have an eye exam every 1-2 years  - Safe sex - if you may be exposed to STDs, use a condom.  - Alcohol -  If you drink, do it moderately, less than 2 drinks per day.  - Health Care Power of Attorney. Choose someone to speak for you if you are not able.  - Depression is common in our stressful world.If you're feeling down or losing interest in things you normally enjoy, please come in for a visit.  - Violence - If anyone is threatening or hurting you, please call immediately.

## 2023-02-26 NOTE — Progress Notes (Signed)
Complete physical exam  Patient: Victoria Baldwin   DOB: 1970/06/23   52 y.o. Female  MRN: [id number] Visit Date: 02/26/2023  Today's healthcare provider: Jacky Kindle, FNP  Re-introduced to nurse practitioner role and practice setting.  All questions answered.  Discussed provider/patient relationship and expectations.  Chief Complaint  Patient presents with   Annual Exam   Subjective    Victoria Baldwin is a 52 y.o. female who presents today for a complete physical exam.  She reports consuming a general diet. The patient does not participate in regular exercise at present. She generally feels well. She reports sleeping well. She does have additional problems to discuss today.   HPI   Past Medical History:  Diagnosis Date   Anxiety    Past Surgical History:  Procedure Laterality Date   BUNIONECTOMY Left    COLONOSCOPY     COLONOSCOPY     COLONOSCOPY WITH PROPOFOL N/A 03/23/2022   Procedure: COLONOSCOPY WITH PROPOFOL;  Surgeon: Toney Reil, MD;  Location: [facility] ENDOSCOPY;  Service: Gastroenterology;  Laterality: N/A;   HYSTEROSCOPY WITH D & C N/A 12/03/2019   Procedure: DILATATION AND CURETTAGE /HYSTEROSCOPY;  Surgeon: Natale Milch, MD;  Location: [facility] ORS;  Service: Gynecology;  Laterality: N/A;   Social History   Socioeconomic History   Marital status: Married    Spouse name: Not on file   Number of children: Not on file   Years of education: Not on file   Highest education level: Not on file  Occupational History   Not on file  Tobacco Use   Smoking status: Never   Smokeless tobacco: Never  Vaping Use   Vaping status: Never Used  Substance and Sexual Activity   Alcohol use: Yes    Alcohol/week: 4.0 standard drinks of alcohol    Types: 4 Glasses of wine per week    Comment: none last 24 hrs   Drug use: No   Sexual activity: Not on file  Other Topics Concern   Not on file  Social History Narrative   Not on file   Social  Determinants of Health   Financial Resource Strain: Not on file  Food Insecurity: Not on file  Transportation Needs: Not on file  Physical Activity: Not on file  Stress: Not on file  Social Connections: Not on file  Intimate Partner Violence: Not on file   Family Status  Relation Name Status   Mat Aunt  Alive   MGM  Alive   PGM  Deceased   Cousin  Deceased  No partnership data on file   Family History  Problem Relation Age of Onset   Breast cancer Maternal Aunt 57   Breast cancer Maternal Grandmother 66   Breast cancer Paternal Grandmother 27   Breast cancer Cousin 45       mat cousin   Allergies  Allergen Reactions   Adhesive [Tape] Rash   Other Rash    HEMA   Steri-Strip Compound Benzoin [Benzoin Compound] Rash    Patient Care Team: Jacky Kindle, FNP as PCP - General (Family Medicine)   Medications: Outpatient Medications Prior to Visit  Medication Sig   Multiple Vitamin (MULTIVITAMIN WITH MINERALS) TABS tablet Take 1 tablet by mouth daily.   sertraline (ZOLOFT) 50 MG tablet Take 1 tablet (50 mg total) by mouth daily.   [DISCONTINUED] ibuprofen (ADVIL) 600 MG tablet Take 1 tablet (600 mg total) by mouth every 6 (six) hours as needed.  No facility-administered medications prior to visit.    Objective    BP 113/74 (BP Location: Left Arm, Patient Position: Sitting, Cuff Size: Large)   Pulse 70   Ht 5\' 7"  (1.702 m)   Wt 156 lb 1.6 oz (70.8 kg)   LMP 12/01/2014   SpO2 100%   BMI 24.45 kg/m   Physical Exam Vitals and nursing note reviewed.  Constitutional:      General: She is awake. She is not in acute distress.    Appearance: Normal appearance. She is well-developed, well-groomed and normal weight. She is not ill-appearing, toxic-appearing or diaphoretic.  HENT:     Head: Normocephalic and atraumatic.     Jaw: There is normal jaw occlusion. No trismus, tenderness, swelling or pain on movement.     Right Ear: Hearing, tympanic membrane, ear canal and  external ear normal. There is no impacted cerumen.     Left Ear: Hearing, tympanic membrane, ear canal and external ear normal. There is no impacted cerumen.     Nose: Nose normal. No congestion or rhinorrhea.     Right Turbinates: Not enlarged, swollen or pale.     Left Turbinates: Not enlarged, swollen or pale.     Right Sinus: No maxillary sinus tenderness or frontal sinus tenderness.     Left Sinus: No maxillary sinus tenderness or frontal sinus tenderness.     Mouth/Throat:     Lips: Pink.     Mouth: Mucous membranes are moist. No injury.     Tongue: No lesions.     Pharynx: Oropharynx is clear. Uvula midline. No pharyngeal swelling, oropharyngeal exudate, posterior oropharyngeal erythema or uvula swelling.     Tonsils: No tonsillar exudate or tonsillar abscesses.  Eyes:     General: Lids are normal. Lids are everted, no foreign bodies appreciated. Vision grossly intact. Gaze aligned appropriately. No allergic shiner or visual field deficit.       Right eye: No discharge.        Left eye: No discharge.     Extraocular Movements: Extraocular movements intact.     Conjunctiva/sclera: Conjunctivae normal.     Right eye: Right conjunctiva is not injected. No exudate.    Left eye: Left conjunctiva is not injected. No exudate.    Pupils: Pupils are equal, round, and reactive to light.  Neck:     Thyroid: No thyroid mass, thyromegaly or thyroid tenderness.     Vascular: No carotid bruit.     Trachea: Trachea normal.  Cardiovascular:     Rate and Rhythm: Normal rate and regular rhythm.     Pulses: Normal pulses.          Carotid pulses are 2+ on the right side and 2+ on the left side.      Radial pulses are 2+ on the right side and 2+ on the left side.       Dorsalis pedis pulses are 2+ on the right side and 2+ on the left side.       Posterior tibial pulses are 2+ on the right side and 2+ on the left side.     Heart sounds: Normal heart sounds, S1 normal and S2 normal. No murmur  heard.    No friction rub. No gallop.  Pulmonary:     Effort: Pulmonary effort is normal. No respiratory distress.     Breath sounds: Normal breath sounds and air entry. No stridor. No wheezing, rhonchi or rales.  Chest:     Chest wall:  No tenderness.  Abdominal:     General: Abdomen is flat. Bowel sounds are normal. There is no distension.     Palpations: Abdomen is soft. There is no mass.     Tenderness: There is no abdominal tenderness. There is no right CVA tenderness, left CVA tenderness, guarding or rebound.     Hernia: No hernia is present.  Genitourinary:    Comments: Exam deferred; denies complaints Musculoskeletal:        General: Tenderness present. No swelling, deformity or signs of injury. Normal range of motion.     Cervical back: Full passive range of motion without pain, normal range of motion and neck supple. No edema, rigidity or tenderness. No muscular tenderness.     Right lower leg: No edema.     Left lower leg: No edema.     Comments: Hip tenderness on exam L>R  Lymphadenopathy:     Cervical: No cervical adenopathy.     Right cervical: No superficial, deep or posterior cervical adenopathy.    Left cervical: No superficial, deep or posterior cervical adenopathy.  Skin:    General: Skin is warm and dry.     Capillary Refill: Capillary refill takes less than 2 seconds.     Coloration: Skin is not jaundiced or pale.     Findings: No bruising, erythema, lesion or rash.  Neurological:     General: No focal deficit present.     Mental Status: She is alert and oriented to person, place, and time. Mental status is at baseline.     GCS: GCS eye subscore is 4. GCS verbal subscore is 5. GCS motor subscore is 6.     Sensory: Sensation is intact. No sensory deficit.     Motor: Motor function is intact. No weakness.     Coordination: Coordination is intact. Coordination normal.     Gait: Gait is intact. Gait normal.  Psychiatric:        Attention and Perception: Attention  and perception normal.        Mood and Affect: Mood and affect normal.        Speech: Speech normal.        Behavior: Behavior normal. Behavior is cooperative.        Thought Content: Thought content normal.        Cognition and Memory: Cognition and memory normal.        Judgment: Judgment normal.     Last depression screening scores    02/26/2023   12:44 PM 02/07/2022    2:01 PM  PHQ 2/9 Scores  PHQ - 2 Score 0 0  PHQ- 9 Score  3   Last fall risk screening    02/07/2022    2:01 PM  Fall Risk   Falls in the past year? 0  Number falls in past yr: 0  Injury with Fall? 0  Risk for fall due to : No Fall Risks  Follow up Falls evaluation completed   Last Audit-C alcohol use screening    02/07/2022    2:01 PM  Alcohol Use Disorder Test (AUDIT)  1. How often do you have a drink containing alcohol? 4  2. How many drinks containing alcohol do you have on a typical day when you are drinking? 1  3. How often do you have six or more drinks on one occasion? 1  AUDIT-C Score 6   A score of 3 or more in women, and 4 or more in men indicates increased risk  for alcohol abuse, EXCEPT if all of the points are from question 1   No results found for any visits on 02/26/23.  Assessment & Plan    Routine Health Maintenance and Physical Exam  Exercise Activities and Dietary recommendations  Goals   None     Immunization History  Administered Date(s) Administered   Influenza, Seasonal, Injecte, Preservative Fre 02/26/2023   Influenza,inj,Quad PF,6+ Mos 02/26/2019, 06/19/2020, 02/07/2022   Influenza-Unspecified 03/17/2014, 03/08/2016, 03/28/2017, 03/31/2018   PFIZER Comirnaty(Gray Top)Covid-19 Tri-Sucrose Vaccine 10/08/2019, 10/29/2019   PFIZER(Purple Top)SARS-COV-2 Vaccination 06/19/2020   Tdap 07/08/2014   Zoster Recombinant(Shingrix) 02/07/2022, 04/11/2022    Health Maintenance  Topic Date Due   HIV Screening  Never done   Hepatitis C Screening  Never done   COVID-19 Vaccine  (4 - 2023-24 season) 02/17/2023   MAMMOGRAM  03/28/2024   DTaP/Tdap/Td (2 - Td or Tdap) 07/08/2024   PAP SMEAR-Modifier  08/13/2024   Colonoscopy  03/24/2027   INFLUENZA VACCINE  Completed   Zoster Vaccines- Shingrix  Completed   HPV VACCINES  Aged Out    Discussed health benefits of physical activity, and encouraged her to engage in regular exercise appropriate for her age and condition.  Problem List Items Addressed This Visit       Nervous and Auditory   Piriformis syndrome of left side    Acute, self limiting Reports of L hip pain; however, tightness noted on both sides Exercises provided; encouraged to rest, ice, use APAP as needed Defer referral at this time to sports med and/or PT/orthopedist         Other   Annual physical exam - Primary    Complaints of L hip pain UTD on dental and vision UTD on PAP Due for screening for mammogram, denies breast concerns, provided with phone number to call and schedule appointment for mammogram. Encouraged to repeat breast cancer screening every 1-2 years. Things to do to keep yourself healthy  - Exercise at least 30-45 minutes a day, 3-4 days a week.  - Eat a low-fat diet with lots of fruits and vegetables, up to 7-9 servings per day.  - Seatbelts can save your life. Wear them always.  - Smoke detectors on every level of your home, check batteries every year.  - Eye Doctor - have an eye exam every 1-2 years  - Safe sex - if you may be exposed to STDs, use a condom.  - Alcohol -  If you drink, do it moderately, less than 2 drinks per day.  - Health Care Power of Attorney. Choose someone to speak for you if you are not able.  - Depression is common in our stressful world.If you're feeling down or losing interest in things you normally enjoy, please come in for a visit.  - Violence - If anyone is threatening or hurting you, please call immediately.       Relevant Orders   MM 3D SCREENING MAMMOGRAM BILATERAL BREAST   HIV antibody  (with reflex)   Hepatitis C Antibody   CBC with Differential/Platelet   Comprehensive Metabolic Panel (CMET)   TSH   Hemoglobin A1c   Lipid panel   Encounter for screening mammogram for malignant neoplasm of breast   Flu vaccine need   Relevant Orders   Flu vaccine trivalent PF, 6mos and older(Flulaval,Afluria,Fluarix,Fluzone) (Completed)   Mood disorder (HCC)    Chronic, stable Wishes to continue zoloft 25 mg; some complaints of perimenopausal symptoms; however, does not wish to change to paxil  to assist due to fear for weight gain Declines refills at this time; reports she has sufficient quantity on hand       Return in about 1 year (around 02/27/2024), or if symptoms worsen or fail to improve-hip, for annual examination.    Leilani Merl, FNP, have reviewed all documentation for this visit. The documentation on 02/26/23 for the exam, diagnosis, procedures, and orders are all accurate and complete.  Jacky Kindle, FNP  [facility] [phone] (phone) [phone] (fax)  [facility]

## 2023-02-26 NOTE — Assessment & Plan Note (Signed)
Chronic, stable Wishes to continue zoloft 25 mg; some complaints of perimenopausal symptoms; however, does not wish to change to paxil to assist due to fear for weight gain Declines refills at this time; reports she has sufficient quantity on hand

## 2023-02-26 NOTE — Assessment & Plan Note (Signed)
Acute, self limiting Reports of L hip pain; however, tightness noted on both sides Exercises provided; encouraged to rest, ice, use APAP as needed Defer referral at this time to sports med and/or PT/orthopedist

## 2023-02-27 ENCOUNTER — Other Ambulatory Visit: Payer: Self-pay | Admitting: Family Medicine

## 2023-02-27 LAB — CBC WITH DIFFERENTIAL/PLATELET
Basophils Absolute: 0 10*3/uL (ref 0.0–0.2)
Basos: 0 %
EOS (ABSOLUTE): 0.1 10*3/uL (ref 0.0–0.4)
Eos: 2 %
Hematocrit: 46.8 % — ABNORMAL HIGH (ref 34.0–46.6)
Hemoglobin: 15.3 g/dL (ref 11.1–15.9)
Immature Grans (Abs): 0 10*3/uL (ref 0.0–0.1)
Immature Granulocytes: 0 %
Lymphocytes Absolute: 2.8 10*3/uL (ref 0.7–3.1)
Lymphs: 40 %
MCH: 30.7 pg (ref 26.6–33.0)
MCHC: 32.7 g/dL (ref 31.5–35.7)
MCV: 94 fL (ref 79–97)
Monocytes Absolute: 0.6 10*3/uL (ref 0.1–0.9)
Monocytes: 9 %
Neutrophils Absolute: 3.5 10*3/uL (ref 1.4–7.0)
Neutrophils: 49 %
Platelets: 322 10*3/uL (ref 150–450)
RBC: 4.99 x10E6/uL (ref 3.77–5.28)
RDW: 12.9 % (ref 11.7–15.4)
WBC: 7.1 10*3/uL (ref 3.4–10.8)

## 2023-02-27 LAB — COMPREHENSIVE METABOLIC PANEL
ALT: 7 IU/L (ref 0–32)
AST: 18 IU/L (ref 0–40)
Albumin: 4.8 g/dL (ref 3.8–4.9)
Alkaline Phosphatase: 111 IU/L (ref 44–121)
BUN/Creatinine Ratio: 13 (ref 9–23)
BUN: 15 mg/dL (ref 6–24)
Bilirubin Total: 0.7 mg/dL (ref 0.0–1.2)
CO2: 24 mmol/L (ref 20–29)
Calcium: 10.1 mg/dL (ref 8.7–10.2)
Chloride: 102 mmol/L (ref 96–106)
Creatinine, Ser: 1.12 mg/dL — ABNORMAL HIGH (ref 0.57–1.00)
Globulin, Total: 2.6 g/dL (ref 1.5–4.5)
Glucose: 102 mg/dL — ABNORMAL HIGH (ref 70–99)
Potassium: 5 mmol/L (ref 3.5–5.2)
Sodium: 140 mmol/L (ref 134–144)
Total Protein: 7.4 g/dL (ref 6.0–8.5)
eGFR: 58 mL/min/{1.73_m2} — ABNORMAL LOW (ref >59)

## 2023-02-27 LAB — TSH: TSH: 2.15 u[IU]/mL (ref 0.450–4.500)

## 2023-02-27 LAB — LIPID PANEL
Chol/HDL Ratio: 5.8 ratio — ABNORMAL HIGH (ref 0.0–4.4)
Cholesterol, Total: 301 mg/dL — ABNORMAL HIGH (ref 100–199)
HDL: 52 mg/dL (ref >39)
LDL Chol Calc (NIH): 212 mg/dL — ABNORMAL HIGH (ref 0–99)
Triglycerides: 193 mg/dL — ABNORMAL HIGH (ref 0–149)
VLDL Cholesterol Cal: 37 mg/dL (ref 5–40)

## 2023-02-27 LAB — HEMOGLOBIN A1C
Est. average glucose Bld gHb Est-mCnc: 111 mg/dL
Hgb A1c MFr Bld: 5.5 % (ref 4.8–5.6)

## 2023-02-27 LAB — HIV ANTIBODY (ROUTINE TESTING W REFLEX): HIV Screen 4th Generation wRfx: NONREACTIVE

## 2023-02-27 LAB — HEPATITIS C ANTIBODY: Hep C Virus Ab: NONREACTIVE

## 2023-02-27 MED ORDER — FENOFIBRATE 145 MG PO TABS: 145.0000 mg | ORAL_TABLET | Freq: Every day | ORAL | 11 refills | Status: AC

## 2023-02-27 MED ORDER — ROSUVASTATIN CALCIUM 40 MG PO TABS: 40.0000 mg | ORAL_TABLET | Freq: Every day | ORAL | 11 refills | Status: AC

## 2023-03-13 ENCOUNTER — Other Ambulatory Visit: Payer: Self-pay | Admitting: Family Medicine

## 2023-03-13 DIAGNOSIS — F419 Anxiety disorder, unspecified: Secondary | ICD-10-CM

## 2023-04-03 ENCOUNTER — Ambulatory Visit
Admission: RE | Admit: 2023-04-03 | Discharge: 2023-04-03 | Disposition: A | Discharge status: Home / Self Care | Payer: BC Managed Care – PPO | Source: Ambulatory Visit | Attending: Family Medicine | Admitting: Family Medicine

## 2023-04-03 DIAGNOSIS — Z1231 Encounter for screening mammogram for malignant neoplasm of breast: Secondary | ICD-10-CM | POA: Diagnosis not present

## 2023-04-03 DIAGNOSIS — Z0000 Encounter for general adult medical examination without abnormal findings: Secondary | ICD-10-CM

## 2024-02-27 ENCOUNTER — Other Ambulatory Visit: Payer: Self-pay | Admitting: Nurse Practitioner

## 2024-02-27 ENCOUNTER — Encounter: Payer: Self-pay | Admitting: Family Medicine

## 2024-02-27 ENCOUNTER — Encounter: Payer: BC Managed Care – PPO | Admitting: Family Medicine

## 2024-02-27 DIAGNOSIS — Z1231 Encounter for screening mammogram for malignant neoplasm of breast: Secondary | ICD-10-CM

## 2024-02-27 DIAGNOSIS — N951 Menopausal and female climacteric states: Secondary | ICD-10-CM

## 2024-05-01 ENCOUNTER — Other Ambulatory Visit: Payer: Self-pay

## 2024-05-01 ENCOUNTER — Telehealth: Payer: Self-pay | Admitting: Family Medicine

## 2024-05-01 DIAGNOSIS — F419 Anxiety disorder, unspecified: Secondary | ICD-10-CM

## 2024-05-01 MED ORDER — SERTRALINE HCL 50 MG PO TABS: 50.0000 mg | ORAL_TABLET | Freq: Every day | ORAL | 0 refills | Status: DC

## 2024-05-01 NOTE — Telephone Encounter (Signed)
 Converted into refill request. Patient needs appointment.

## 2024-05-01 NOTE — Telephone Encounter (Signed)
[facility] faxed refill request for the following medications:  sertraline (ZOLOFT) 50 MG tablet   Please advise.  

## 2024-06-02 ENCOUNTER — Other Ambulatory Visit: Payer: Self-pay

## 2024-06-02 DIAGNOSIS — F419 Anxiety disorder, unspecified: Secondary | ICD-10-CM

## 2024-06-22 ENCOUNTER — Ambulatory Visit
Admission: RE | Admit: 2024-06-22 | Discharge: 2024-06-22 | Disposition: A | Discharge status: Home / Self Care | Payer: Self-pay | Source: Ambulatory Visit | Attending: Nurse Practitioner | Admitting: Nurse Practitioner

## 2024-06-22 DIAGNOSIS — N951 Menopausal and female climacteric states: Secondary | ICD-10-CM | POA: Insufficient documentation

## 2024-06-22 DIAGNOSIS — Z1231 Encounter for screening mammogram for malignant neoplasm of breast: Secondary | ICD-10-CM | POA: Diagnosis present

## 2024-06-24 ENCOUNTER — Other Ambulatory Visit: Payer: Self-pay | Admitting: Medical Genetics

## 2024-06-30 ENCOUNTER — Other Ambulatory Visit
Admission: RE | Admit: 2024-06-30 | Discharge: 2024-06-30 | Disposition: A | Discharge status: Home / Self Care | Payer: Self-pay | Source: Ambulatory Visit | Attending: Medical Genetics | Admitting: Medical Genetics

## 2024-07-10 LAB — GENECONNECT MOLECULAR SCREEN: Genetic Analysis Overall Interpretation: NEGATIVE
